# Patient Record
Sex: Female | Born: 1955 | Race: White | Marital: Single | Smoking: Current every day smoker
Health system: Southern US, Community
[De-identification: ages and names within clinical notes are randomized; demographics above are authoritative.]

## PROBLEM LIST (undated history)

## (undated) DIAGNOSIS — I639 Cerebral infarction, unspecified: Secondary | ICD-10-CM

## (undated) DIAGNOSIS — E039 Hypothyroidism, unspecified: Secondary | ICD-10-CM

## (undated) DIAGNOSIS — I10 Essential (primary) hypertension: Secondary | ICD-10-CM

## (undated) DIAGNOSIS — E785 Hyperlipidemia, unspecified: Secondary | ICD-10-CM

---

## 2007-03-20 ENCOUNTER — Emergency Department: Payer: Self-pay | Admitting: Emergency Medicine

## 2014-02-21 ENCOUNTER — Ambulatory Visit: Payer: Self-pay | Admitting: Emergency Medicine

## 2014-02-21 LAB — TSH: Thyroid Stimulating Horm: 95.5 u[IU]/mL — ABNORMAL HIGH

## 2015-04-26 ENCOUNTER — Encounter: Payer: Self-pay | Admitting: Internal Medicine

## 2015-04-26 ENCOUNTER — Other Ambulatory Visit: Payer: Self-pay | Admitting: Internal Medicine

## 2015-04-26 DIAGNOSIS — I10 Essential (primary) hypertension: Secondary | ICD-10-CM | POA: Insufficient documentation

## 2015-04-26 DIAGNOSIS — F17200 Nicotine dependence, unspecified, uncomplicated: Secondary | ICD-10-CM | POA: Insufficient documentation

## 2015-04-26 DIAGNOSIS — I6350 Cerebral infarction due to unspecified occlusion or stenosis of unspecified cerebral artery: Secondary | ICD-10-CM

## 2015-04-26 DIAGNOSIS — E890 Postprocedural hypothyroidism: Secondary | ICD-10-CM | POA: Insufficient documentation

## 2015-04-26 DIAGNOSIS — I639 Cerebral infarction, unspecified: Secondary | ICD-10-CM | POA: Insufficient documentation

## 2018-08-28 ENCOUNTER — Other Ambulatory Visit: Payer: Self-pay

## 2018-08-28 ENCOUNTER — Encounter (HOSPITAL_COMMUNITY): Payer: Self-pay | Admitting: Emergency Medicine

## 2018-08-28 ENCOUNTER — Emergency Department (HOSPITAL_COMMUNITY): Payer: Managed Care, Other (non HMO)

## 2018-08-28 ENCOUNTER — Inpatient Hospital Stay (HOSPITAL_COMMUNITY)
Admission: EM | Admit: 2018-08-28 | Discharge: 2018-08-30 | DRG: 065 | Disposition: A | Discharge status: Home / Self Care | Payer: Managed Care, Other (non HMO) | Attending: Family Medicine | Admitting: Family Medicine

## 2018-08-28 DIAGNOSIS — R402252 Coma scale, best verbal response, oriented, at arrival to emergency department: Secondary | ICD-10-CM | POA: Diagnosis present

## 2018-08-28 DIAGNOSIS — R402362 Coma scale, best motor response, obeys commands, at arrival to emergency department: Secondary | ICD-10-CM | POA: Diagnosis present

## 2018-08-28 DIAGNOSIS — R29810 Facial weakness: Secondary | ICD-10-CM | POA: Diagnosis present

## 2018-08-28 DIAGNOSIS — F17210 Nicotine dependence, cigarettes, uncomplicated: Secondary | ICD-10-CM | POA: Diagnosis present

## 2018-08-28 DIAGNOSIS — Z79890 Hormone replacement therapy (postmenopausal): Secondary | ICD-10-CM

## 2018-08-28 DIAGNOSIS — I10 Essential (primary) hypertension: Secondary | ICD-10-CM | POA: Diagnosis not present

## 2018-08-28 DIAGNOSIS — Z7982 Long term (current) use of aspirin: Secondary | ICD-10-CM

## 2018-08-28 DIAGNOSIS — Z8673 Personal history of transient ischemic attack (TIA), and cerebral infarction without residual deficits: Secondary | ICD-10-CM

## 2018-08-28 DIAGNOSIS — E890 Postprocedural hypothyroidism: Secondary | ICD-10-CM | POA: Diagnosis present

## 2018-08-28 DIAGNOSIS — I639 Cerebral infarction, unspecified: Secondary | ICD-10-CM | POA: Diagnosis not present

## 2018-08-28 DIAGNOSIS — R402132 Coma scale, eyes open, to sound, at arrival to emergency department: Secondary | ICD-10-CM | POA: Diagnosis present

## 2018-08-28 DIAGNOSIS — R29703 NIHSS score 3: Secondary | ICD-10-CM | POA: Diagnosis present

## 2018-08-28 DIAGNOSIS — R471 Dysarthria and anarthria: Secondary | ICD-10-CM | POA: Diagnosis present

## 2018-08-28 DIAGNOSIS — I6339 Cerebral infarction due to thrombosis of other cerebral artery: Secondary | ICD-10-CM

## 2018-08-28 DIAGNOSIS — G8191 Hemiplegia, unspecified affecting right dominant side: Secondary | ICD-10-CM | POA: Diagnosis present

## 2018-08-28 DIAGNOSIS — E785 Hyperlipidemia, unspecified: Secondary | ICD-10-CM | POA: Diagnosis present

## 2018-08-28 HISTORY — DX: Cerebral infarction, unspecified: I63.9

## 2018-08-28 HISTORY — DX: Essential (primary) hypertension: I10

## 2018-08-28 HISTORY — DX: Hypothyroidism, unspecified: E03.9

## 2018-08-28 LAB — CBC
HEMATOCRIT: 39.8 % (ref 36.0–46.0)
Hemoglobin: 13.2 g/dL (ref 12.0–15.0)
MCH: 32.1 pg (ref 26.0–34.0)
MCHC: 33.2 g/dL (ref 30.0–36.0)
MCV: 96.8 fL (ref 80.0–100.0)
Platelets: 243 10*3/uL (ref 150–400)
RBC: 4.11 MIL/uL (ref 3.87–5.11)
RDW: 12.4 % (ref 11.5–15.5)
WBC: 9.6 10*3/uL (ref 4.0–10.5)
nRBC: 0 % (ref 0.0–0.2)

## 2018-08-28 LAB — T4, FREE: Free T4: 1.12 ng/dL (ref 0.82–1.77)

## 2018-08-28 LAB — HEMOGLOBIN A1C
HEMOGLOBIN A1C: 5.4 % (ref 4.8–5.6)
Mean Plasma Glucose: 108.28 mg/dL

## 2018-08-28 LAB — COMPREHENSIVE METABOLIC PANEL
ALK PHOS: 72 U/L (ref 38–126)
ALT: 21 U/L (ref 0–44)
ANION GAP: 6 (ref 5–15)
AST: 17 U/L (ref 15–41)
Albumin: 3.9 g/dL (ref 3.5–5.0)
BUN: 13 mg/dL (ref 8–23)
CO2: 25 mmol/L (ref 22–32)
Calcium: 9.5 mg/dL (ref 8.9–10.3)
Chloride: 104 mmol/L (ref 98–111)
Creatinine, Ser: 0.71 mg/dL (ref 0.44–1.00)
GFR calc Af Amer: >60 mL/min (ref >60)
GFR calc non Af Amer: >60 mL/min (ref >60)
Glucose, Bld: 106 mg/dL — ABNORMAL HIGH (ref 70–99)
Potassium: 3.9 mmol/L (ref 3.5–5.1)
Sodium: 135 mmol/L (ref 135–145)
Total Bilirubin: 0.4 mg/dL (ref 0.3–1.2)
Total Protein: 6.9 g/dL (ref 6.5–8.1)

## 2018-08-28 LAB — POCT I-STAT 7, (LYTES, BLD GAS, ICA,H+H)
Acid-Base Excess: 1 mmol/L (ref 0.0–2.0)
Bicarbonate: 25.7 mmol/L (ref 20.0–28.0)
CALCIUM ION: 1.23 mmol/L (ref 1.15–1.40)
HEMATOCRIT: 36 % (ref 36.0–46.0)
Hemoglobin: 12.2 g/dL (ref 12.0–15.0)
O2 Saturation: 95 %
Potassium: 3.8 mmol/L (ref 3.5–5.1)
Sodium: 140 mmol/L (ref 135–145)
TCO2: 27 mmol/L (ref 22–32)
pCO2 arterial: 41.4 mmHg (ref 32.0–48.0)
pH, Arterial: 7.402 (ref 7.350–7.450)
pO2, Arterial: 74 mmHg — ABNORMAL LOW (ref 83.0–108.0)

## 2018-08-28 LAB — LIPID PANEL
Cholesterol: 228 mg/dL — ABNORMAL HIGH (ref 0–200)
HDL: 48 mg/dL (ref >40)
LDL Cholesterol: 167 mg/dL — ABNORMAL HIGH (ref 0–99)
Total CHOL/HDL Ratio: 4.8 RATIO
Triglycerides: 64 mg/dL (ref <150)
VLDL: 13 mg/dL (ref 0–40)

## 2018-08-28 LAB — DIFFERENTIAL
Abs Immature Granulocytes: 0.02 10*3/uL (ref 0.00–0.07)
Basophils Absolute: 0.1 10*3/uL (ref 0.0–0.1)
Basophils Relative: 1 %
Eosinophils Absolute: 0.2 10*3/uL (ref 0.0–0.5)
Eosinophils Relative: 2 %
IMMATURE GRANULOCYTES: 0 %
LYMPHS ABS: 2.4 10*3/uL (ref 0.7–4.0)
Lymphocytes Relative: 25 %
Monocytes Absolute: 0.5 10*3/uL (ref 0.1–1.0)
Monocytes Relative: 5 %
Neutro Abs: 6.3 10*3/uL (ref 1.7–7.7)
Neutrophils Relative %: 67 %

## 2018-08-28 LAB — APTT: aPTT: 30 seconds (ref 24–36)

## 2018-08-28 LAB — TSH: TSH: 0.86 u[IU]/mL (ref 0.350–4.500)

## 2018-08-28 LAB — ETHANOL: Alcohol, Ethyl (B): <10 mg/dL (ref <10)

## 2018-08-28 LAB — PROTIME-INR
INR: 0.9 (ref 0.8–1.2)
Prothrombin Time: 12.5 seconds (ref 11.4–15.2)

## 2018-08-28 MED ORDER — GADOBUTROL 1 MMOL/ML IV SOLN
10.0000 mL | Freq: Once | INTRAVENOUS | Status: AC | PRN
  Administered 2018-08-28: 10 mL via INTRAVENOUS

## 2018-08-28 MED ORDER — ATORVASTATIN CALCIUM 40 MG PO TABS
40.0000 mg | ORAL_TABLET | Freq: Every day | ORAL | Status: DC
  Administered 2018-08-29: 40 mg via ORAL
  Filled 2018-08-28: qty 1

## 2018-08-28 MED ORDER — ENOXAPARIN SODIUM 40 MG/0.4ML ~~LOC~~ SOLN
40.0000 mg | SUBCUTANEOUS | Status: DC
  Administered 2018-08-29: 40 mg via SUBCUTANEOUS
  Filled 2018-08-28: qty 0.4

## 2018-08-28 MED ORDER — NICOTINE 21 MG/24HR TD PT24
21.0000 mg | MEDICATED_PATCH | Freq: Every day | TRANSDERMAL | Status: DC
  Filled 2018-08-28: qty 1

## 2018-08-28 MED ORDER — ASPIRIN EC 81 MG PO TBEC
81.0000 mg | DELAYED_RELEASE_TABLET | Freq: Every day | ORAL | Status: DC
  Administered 2018-08-29 – 2018-08-30 (×2): 81 mg via ORAL
  Filled 2018-08-28 (×2): qty 1

## 2018-08-28 MED ORDER — ONDANSETRON HCL 4 MG PO TABS: 4.0000 mg | ORAL_TABLET | Freq: Four times a day (QID) | ORAL | Status: DC | PRN

## 2018-08-28 MED ORDER — ACETAMINOPHEN 325 MG PO TABS: 650.0000 mg | ORAL_TABLET | Freq: Four times a day (QID) | ORAL | Status: DC | PRN

## 2018-08-28 MED ORDER — SODIUM CHLORIDE 0.9% FLUSH: 3.0000 mL | INTRAVENOUS | Status: DC | PRN

## 2018-08-28 MED ORDER — SODIUM CHLORIDE 0.9% FLUSH
3.0000 mL | Freq: Two times a day (BID) | INTRAVENOUS | Status: DC
  Administered 2018-08-28: 3 mL via INTRAVENOUS

## 2018-08-28 MED ORDER — SODIUM CHLORIDE 0.9 % IV SOLN: 250.0000 mL | INTRAVENOUS | Status: DC | PRN

## 2018-08-28 MED ORDER — ACETAMINOPHEN 650 MG RE SUPP: 650.0000 mg | Freq: Four times a day (QID) | RECTAL | Status: DC | PRN

## 2018-08-28 MED ORDER — STROKE: EARLY STAGES OF RECOVERY BOOK
Freq: Once | Status: AC
  Administered 2018-08-29: 1
  Filled 2018-08-28: qty 1

## 2018-08-28 MED ORDER — ONDANSETRON HCL 4 MG/2ML IJ SOLN: 4.0000 mg | Freq: Four times a day (QID) | INTRAMUSCULAR | Status: DC | PRN
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
## 2018-08-28 NOTE — ED Notes (Signed)
Patient is lethargic and unable to stay awake; also unable to keep saliva in mouth when talking-she has therefore failed the swallow test for now Will re-assess when appropriate
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
## 2018-08-28 NOTE — ED Notes (Signed)
Patient transported to MRI 
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
## 2018-08-28 NOTE — ED Notes (Signed)
Patient's daughter at bedside Reports that PT had a stroke 3 years ago.  Patient reports taking "125mg  of thyroid medicine."

## 2018-08-29 ENCOUNTER — Inpatient Hospital Stay (HOSPITAL_COMMUNITY): Payer: Managed Care, Other (non HMO)

## 2018-08-29 ENCOUNTER — Observation Stay (HOSPITAL_COMMUNITY): Payer: Managed Care, Other (non HMO)

## 2018-08-29 DIAGNOSIS — E890 Postprocedural hypothyroidism: Secondary | ICD-10-CM | POA: Diagnosis present

## 2018-08-29 DIAGNOSIS — I6381 Other cerebral infarction due to occlusion or stenosis of small artery: Secondary | ICD-10-CM

## 2018-08-29 DIAGNOSIS — G8191 Hemiplegia, unspecified affecting right dominant side: Secondary | ICD-10-CM | POA: Diagnosis present

## 2018-08-29 DIAGNOSIS — R29810 Facial weakness: Secondary | ICD-10-CM | POA: Diagnosis present

## 2018-08-29 DIAGNOSIS — E785 Hyperlipidemia, unspecified: Secondary | ICD-10-CM | POA: Diagnosis present

## 2018-08-29 DIAGNOSIS — F17210 Nicotine dependence, cigarettes, uncomplicated: Secondary | ICD-10-CM | POA: Diagnosis present

## 2018-08-29 DIAGNOSIS — I6350 Cerebral infarction due to unspecified occlusion or stenosis of unspecified cerebral artery: Secondary | ICD-10-CM

## 2018-08-29 DIAGNOSIS — R402252 Coma scale, best verbal response, oriented, at arrival to emergency department: Secondary | ICD-10-CM | POA: Diagnosis present

## 2018-08-29 DIAGNOSIS — R29703 NIHSS score 3: Secondary | ICD-10-CM | POA: Diagnosis present

## 2018-08-29 DIAGNOSIS — Z7982 Long term (current) use of aspirin: Secondary | ICD-10-CM | POA: Diagnosis not present

## 2018-08-29 DIAGNOSIS — R402362 Coma scale, best motor response, obeys commands, at arrival to emergency department: Secondary | ICD-10-CM | POA: Diagnosis present

## 2018-08-29 DIAGNOSIS — I639 Cerebral infarction, unspecified: Secondary | ICD-10-CM | POA: Diagnosis present

## 2018-08-29 DIAGNOSIS — R402132 Coma scale, eyes open, to sound, at arrival to emergency department: Secondary | ICD-10-CM | POA: Diagnosis present

## 2018-08-29 DIAGNOSIS — I10 Essential (primary) hypertension: Secondary | ICD-10-CM | POA: Diagnosis present

## 2018-08-29 DIAGNOSIS — Z79890 Hormone replacement therapy (postmenopausal): Secondary | ICD-10-CM | POA: Diagnosis not present

## 2018-08-29 DIAGNOSIS — R471 Dysarthria and anarthria: Secondary | ICD-10-CM | POA: Diagnosis present

## 2018-08-29 DIAGNOSIS — Z8673 Personal history of transient ischemic attack (TIA), and cerebral infarction without residual deficits: Secondary | ICD-10-CM | POA: Diagnosis not present

## 2018-08-29 LAB — ECHOCARDIOGRAM COMPLETE
Height: 68 in
Weight: 3580.27 oz

## 2018-08-29 LAB — CBC
HCT: 38.4 % (ref 36.0–46.0)
Hemoglobin: 12.6 g/dL (ref 12.0–15.0)
MCH: 31 pg (ref 26.0–34.0)
MCHC: 32.8 g/dL (ref 30.0–36.0)
MCV: 94.6 fL (ref 80.0–100.0)
Platelets: 234 10*3/uL (ref 150–400)
RBC: 4.06 MIL/uL (ref 3.87–5.11)
RDW: 12.3 % (ref 11.5–15.5)
WBC: 8.2 10*3/uL (ref 4.0–10.5)
nRBC: 0 % (ref 0.0–0.2)

## 2018-08-29 LAB — RAPID URINE DRUG SCREEN, HOSP PERFORMED
Amphetamines: NOT DETECTED
BENZODIAZEPINES: NOT DETECTED
Barbiturates: NOT DETECTED
Cocaine: NOT DETECTED
Opiates: NOT DETECTED
Tetrahydrocannabinol: NOT DETECTED

## 2018-08-29 LAB — BASIC METABOLIC PANEL
Anion gap: 9 (ref 5–15)
BUN: 12 mg/dL (ref 8–23)
CO2: 25 mmol/L (ref 22–32)
Calcium: 8.9 mg/dL (ref 8.9–10.3)
Chloride: 105 mmol/L (ref 98–111)
Creatinine, Ser: 0.73 mg/dL (ref 0.44–1.00)
GFR calc Af Amer: >60 mL/min (ref >60)
Glucose, Bld: 110 mg/dL — ABNORMAL HIGH (ref 70–99)
POTASSIUM: 3.5 mmol/L (ref 3.5–5.1)
Sodium: 139 mmol/L (ref 135–145)

## 2018-08-29 LAB — HIV ANTIBODY (ROUTINE TESTING W REFLEX): HIV Screen 4th Generation wRfx: NONREACTIVE

## 2018-08-29 MED ORDER — RESOURCE THICKENUP CLEAR PO POWD
ORAL | Status: DC | PRN
  Filled 2018-08-29: qty 125

## 2018-08-29 MED ORDER — CLOPIDOGREL BISULFATE 75 MG PO TABS
75.0000 mg | ORAL_TABLET | Freq: Every day | ORAL | Status: DC
  Administered 2018-08-30: 75 mg via ORAL
  Filled 2018-08-29: qty 1

## 2018-08-29 MED ORDER — LEVOTHYROXINE SODIUM 75 MCG PO TABS
175.0000 ug | ORAL_TABLET | Freq: Every day | ORAL | Status: DC
  Administered 2018-08-29 – 2018-08-30 (×2): 175 ug via ORAL
  Filled 2018-08-29 (×2): qty 1

## 2018-08-29 MED ORDER — LISINOPRIL 10 MG PO TABS
10.0000 mg | ORAL_TABLET | Freq: Every day | ORAL | Status: DC
  Administered 2018-08-29 – 2018-08-30 (×2): 10 mg via ORAL
  Filled 2018-08-29 (×2): qty 1

## 2018-08-29 MED ORDER — IOHEXOL 350 MG/ML SOLN
75.0000 mL | Freq: Once | INTRAVENOUS | Status: AC | PRN
  Administered 2018-08-29: 75 mL via INTRAVENOUS

## 2018-08-29 NOTE — Progress Notes (Signed)
SLP Cancellation Note  Patient Details Name: Laurie Valenzuela MRN: [id number] DOB: 08/04/1955   Cancelled treatment:       Reason Eval/Treat Not Completed: Patient at procedure or test/unavailable. Transport taking pt to vascular lab. Will continue efforts.  Rondel Baton, [place], CCC-SLP Speech-Language Pathologist Acute Rehabilitation Services Pager: [phone] Office: [phone]    [name] 08/29/2018, 9:24 AM
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
## 2018-08-29 NOTE — Progress Notes (Signed)
Companion, who  Is at bedside, says she is not her normal self. She, at one time got out of bed, half naked, attempting to wipe her bottom with paper towel.  I cleaned  Her up, got her settled. She is confused.,at times, but answers questions appropriately. Dysarthric. Bed alarm on. Safety maintained.

## 2018-08-29 NOTE — Progress Notes (Signed)
  Echocardiogram 2D Echocardiogram has been performed.  Delcie Roch 08/29/2018, 10:34 AM

## 2018-08-29 NOTE — Progress Notes (Signed)
Patient is now more alert, abel to tell name and place but disoriented to time and situation. YALE swallow eval reeval, pt coughs. SLP consulted.    Safety precautions and orders reviewed. PT/family educated on fall risk and using call lights for assistance,. Sig other requests to sit on the bed with pt at this time n requested to have bed exit removed. RN reinforce bed alarms. Family declined and verbalized will let staffs know if pt needing to get up. Will cont to monitor.   Sim Boast, RN
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
## 2018-08-29 NOTE — Progress Notes (Signed)
Sent page to Dr Bruna Potter. B/P 163/89. Patient has been NPO. Urine emptied when first arrived. No urine as of yet. Bed alarm on. Continue to monitor.
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
## 2018-08-29 NOTE — Progress Notes (Signed)
VASCULAR LAB PRELIMINARY  PRELIMINARY  PRELIMINARY  PRELIMINARY  Carotid duplex completed.    Preliminary report:  See CV Proc  Pragya Lofaso, RVT 08/29/2018, 9:52 AM

## 2018-08-30 ENCOUNTER — Other Ambulatory Visit: Payer: Self-pay | Admitting: Cardiology

## 2018-08-30 DIAGNOSIS — I6381 Other cerebral infarction due to occlusion or stenosis of small artery: Secondary | ICD-10-CM

## 2018-08-30 DIAGNOSIS — I639 Cerebral infarction, unspecified: Secondary | ICD-10-CM

## 2018-08-30 LAB — CBC WITH DIFFERENTIAL/PLATELET
Abs Immature Granulocytes: 0.02 10*3/uL (ref 0.00–0.07)
BASOS PCT: 1 %
Basophils Absolute: 0.1 10*3/uL (ref 0.0–0.1)
EOS ABS: 0.2 10*3/uL (ref 0.0–0.5)
Eosinophils Relative: 3 %
HCT: 39.1 % (ref 36.0–46.0)
Hemoglobin: 13.3 g/dL (ref 12.0–15.0)
Immature Granulocytes: 0 %
Lymphocytes Relative: 36 %
Lymphs Abs: 3 10*3/uL (ref 0.7–4.0)
MCH: 32.4 pg (ref 26.0–34.0)
MCHC: 34 g/dL (ref 30.0–36.0)
MCV: 95.1 fL (ref 80.0–100.0)
Monocytes Absolute: 0.6 10*3/uL (ref 0.1–1.0)
Monocytes Relative: 8 %
Neutro Abs: 4.3 10*3/uL (ref 1.7–7.7)
Neutrophils Relative %: 52 %
PLATELETS: 239 10*3/uL (ref 150–400)
RBC: 4.11 MIL/uL (ref 3.87–5.11)
RDW: 12.1 % (ref 11.5–15.5)
WBC: 8.1 10*3/uL (ref 4.0–10.5)
nRBC: 0 % (ref 0.0–0.2)

## 2018-08-30 LAB — BASIC METABOLIC PANEL
Anion gap: 7 (ref 5–15)
BUN: 12 mg/dL (ref 8–23)
CALCIUM: 9 mg/dL (ref 8.9–10.3)
CO2: 27 mmol/L (ref 22–32)
Chloride: 106 mmol/L (ref 98–111)
Creatinine, Ser: 0.8 mg/dL (ref 0.44–1.00)
GFR calc Af Amer: >60 mL/min (ref >60)
GFR calc non Af Amer: >60 mL/min (ref >60)
Glucose, Bld: 104 mg/dL — ABNORMAL HIGH (ref 70–99)
Potassium: 3.7 mmol/L (ref 3.5–5.1)
Sodium: 140 mmol/L (ref 135–145)

## 2018-08-30 MED ORDER — ATORVASTATIN CALCIUM 40 MG PO TABS: 40.0000 mg | ORAL_TABLET | Freq: Every day | ORAL | 0 refills | Status: DC

## 2018-08-30 MED ORDER — LISINOPRIL 10 MG PO TABS: 10.0000 mg | ORAL_TABLET | Freq: Every day | ORAL | 0 refills | Status: DC

## 2018-08-30 MED ORDER — NICOTINE 21 MG/24HR TD PT24: 21.0000 mg | MEDICATED_PATCH | Freq: Every day | TRANSDERMAL | 0 refills | Status: DC

## 2018-08-30 MED ORDER — ASPIRIN 81 MG PO CHEW: 81.0000 mg | CHEWABLE_TABLET | Freq: Every day | ORAL | 0 refills | Status: DC

## 2018-08-30 MED ORDER — RESOURCE THICKENUP CLEAR PO POWD: ORAL | 0 refills | Status: DC

## 2018-08-30 MED ORDER — CLOPIDOGREL BISULFATE 75 MG PO TABS: 75.0000 mg | ORAL_TABLET | Freq: Every day | ORAL | 0 refills | Status: DC
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
## 2018-08-30 NOTE — Progress Notes (Signed)
SLP Cancellation Note  Patient Details Name: Laurie Valenzuela MRN: [id number] DOB: 07-05-1955   Cancelled treatment:       Reason Eval/Treat Not Completed: Other (comment). Attempted to schedule MBS today, however fluoro unable to perform today. Pt continues with overt signs of aspiration with thin liquids, advise DYS 3, NECTAR thick liquids at discharge. Recommend pt return for MBS as an outpatient.   Rondel Baton, [place], CCC-SLP Speech-Language Pathologist Acute Rehabilitation Services Pager: [phone] Office: [phone]    [name] 08/30/2018, 2:47 PM

## 2018-08-30 NOTE — Care Management Note (Signed)
Case Management Note RN CM weekend coverage for [facility] [phone]  Patient Details  Name: Laurie Valenzuela MRN: [id number] Date of Birth: May 20, 1956  Subjective/Objective:  Pt admitted with CVA                  Action/Plan: PTA pt lived at home, referral received for outpt rehab needs for PT/OT/SLP- spoke with pt at bedside to discuss Neuro outpt rehab and location- pt agreeable to Neuro rehab referral. No DME needs noted. Pt also will need info for PCP- needs - discussed using insurance card to ask for in-network providers and will provide pt with [facility] # for assistance.   Expected Discharge Date:  08/29/18               Expected Discharge Plan:  OP Rehab  In-House Referral:     Discharge planning Services  CM Consult, Other - See comment  Post Acute Care Choice:    Choice offered to:  Patient  DME Arranged:    DME Agency:     HH Arranged:    HH Agency:     Status of Service:  Completed, signed off  If discussed at Long Length of Stay Meetings, dates discussed:    Discharge Disposition: home/self care   Additional Comments:  Darrold Span, RN 08/30/2018, 3:30 PM
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
## 2018-08-31 ENCOUNTER — Other Ambulatory Visit: Payer: Self-pay | Admitting: Cardiology

## 2018-08-31 DIAGNOSIS — I639 Cerebral infarction, unspecified: Secondary | ICD-10-CM

## 2018-09-01 ENCOUNTER — Observation Stay: Payer: Managed Care, Other (non HMO)

## 2018-09-01 ENCOUNTER — Ambulatory Visit: Payer: Managed Care, Other (non HMO) | Attending: Family Medicine | Admitting: Occupational Therapy

## 2018-09-01 ENCOUNTER — Other Ambulatory Visit: Payer: Self-pay

## 2018-09-01 ENCOUNTER — Encounter: Payer: Self-pay | Admitting: Emergency Medicine

## 2018-09-01 ENCOUNTER — Observation Stay
Admission: EM | Admit: 2018-09-01 | Discharge: 2018-09-02 | Disposition: A | Discharge status: Other Acute Inpatient Hospital | Payer: Managed Care, Other (non HMO) | Attending: Internal Medicine | Admitting: Internal Medicine

## 2018-09-01 ENCOUNTER — Encounter: Payer: Self-pay | Admitting: Physical Therapy

## 2018-09-01 ENCOUNTER — Emergency Department: Payer: Managed Care, Other (non HMO)

## 2018-09-01 ENCOUNTER — Ambulatory Visit: Payer: Managed Care, Other (non HMO) | Admitting: Physical Therapy

## 2018-09-01 DIAGNOSIS — Z79890 Hormone replacement therapy (postmenopausal): Secondary | ICD-10-CM | POA: Insufficient documentation

## 2018-09-01 DIAGNOSIS — S82391A Other fracture of lower end of right tibia, initial encounter for closed fracture: Secondary | ICD-10-CM | POA: Diagnosis present

## 2018-09-01 DIAGNOSIS — Z79899 Other long term (current) drug therapy: Secondary | ICD-10-CM | POA: Insufficient documentation

## 2018-09-01 DIAGNOSIS — Z87891 Personal history of nicotine dependence: Secondary | ICD-10-CM | POA: Diagnosis not present

## 2018-09-01 DIAGNOSIS — I69851 Hemiplegia and hemiparesis following other cerebrovascular disease affecting right dominant side: Secondary | ICD-10-CM | POA: Insufficient documentation

## 2018-09-01 DIAGNOSIS — E039 Hypothyroidism, unspecified: Secondary | ICD-10-CM | POA: Insufficient documentation

## 2018-09-01 DIAGNOSIS — Z7982 Long term (current) use of aspirin: Secondary | ICD-10-CM | POA: Diagnosis not present

## 2018-09-01 DIAGNOSIS — W010XXA Fall on same level from slipping, tripping and stumbling without subsequent striking against object, initial encounter: Secondary | ICD-10-CM | POA: Diagnosis not present

## 2018-09-01 DIAGNOSIS — R0602 Shortness of breath: Secondary | ICD-10-CM | POA: Insufficient documentation

## 2018-09-01 DIAGNOSIS — R29818 Other symptoms and signs involving the nervous system: Secondary | ICD-10-CM

## 2018-09-01 DIAGNOSIS — S82491A Other fracture of shaft of right fibula, initial encounter for closed fracture: Secondary | ICD-10-CM | POA: Diagnosis not present

## 2018-09-01 DIAGNOSIS — I69318 Other symptoms and signs involving cognitive functions following cerebral infarction: Secondary | ICD-10-CM

## 2018-09-01 DIAGNOSIS — Z7902 Long term (current) use of antithrombotics/antiplatelets: Secondary | ICD-10-CM | POA: Insufficient documentation

## 2018-09-01 DIAGNOSIS — R41842 Visuospatial deficit: Secondary | ICD-10-CM

## 2018-09-01 DIAGNOSIS — E785 Hyperlipidemia, unspecified: Secondary | ICD-10-CM | POA: Insufficient documentation

## 2018-09-01 DIAGNOSIS — M6281 Muscle weakness (generalized): Secondary | ICD-10-CM

## 2018-09-01 DIAGNOSIS — R2689 Other abnormalities of gait and mobility: Secondary | ICD-10-CM

## 2018-09-01 DIAGNOSIS — I10 Essential (primary) hypertension: Secondary | ICD-10-CM | POA: Diagnosis not present

## 2018-09-01 DIAGNOSIS — R2681 Unsteadiness on feet: Secondary | ICD-10-CM

## 2018-09-01 DIAGNOSIS — R278 Other lack of coordination: Secondary | ICD-10-CM

## 2018-09-01 DIAGNOSIS — S82409A Unspecified fracture of shaft of unspecified fibula, initial encounter for closed fracture: Secondary | ICD-10-CM

## 2018-09-01 DIAGNOSIS — S82209A Unspecified fracture of shaft of unspecified tibia, initial encounter for closed fracture: Secondary | ICD-10-CM | POA: Diagnosis present

## 2018-09-01 HISTORY — DX: Hyperlipidemia, unspecified: E78.5

## 2018-09-01 LAB — COMPREHENSIVE METABOLIC PANEL
ALT: 32 U/L (ref 0–44)
ANION GAP: 8 (ref 5–15)
AST: 29 U/L (ref 15–41)
Albumin: 4.1 g/dL (ref 3.5–5.0)
Alkaline Phosphatase: 74 U/L (ref 38–126)
BUN: 18 mg/dL (ref 8–23)
CO2: 27 mmol/L (ref 22–32)
Calcium: 9.1 mg/dL (ref 8.9–10.3)
Chloride: 105 mmol/L (ref 98–111)
Creatinine, Ser: 0.69 mg/dL (ref 0.44–1.00)
GFR calc non Af Amer: >60 mL/min (ref >60)
Glucose, Bld: 109 mg/dL — ABNORMAL HIGH (ref 70–99)
Potassium: 4 mmol/L (ref 3.5–5.1)
Sodium: 140 mmol/L (ref 135–145)
Total Bilirubin: 0.6 mg/dL (ref 0.3–1.2)
Total Protein: 7.4 g/dL (ref 6.5–8.1)

## 2018-09-01 LAB — CBC
HCT: 39.1 % (ref 36.0–46.0)
Hemoglobin: 12.9 g/dL (ref 12.0–15.0)
MCH: 31.9 pg (ref 26.0–34.0)
MCHC: 33 g/dL (ref 30.0–36.0)
MCV: 96.5 fL (ref 80.0–100.0)
Platelets: 260 10*3/uL (ref 150–400)
RBC: 4.05 MIL/uL (ref 3.87–5.11)
RDW: 12 % (ref 11.5–15.5)
WBC: 10.7 10*3/uL — ABNORMAL HIGH (ref 4.0–10.5)
nRBC: 0 % (ref 0.0–0.2)

## 2018-09-01 LAB — URINALYSIS, COMPLETE (UACMP) WITH MICROSCOPIC
Bilirubin Urine: NEGATIVE
GLUCOSE, UA: NEGATIVE mg/dL
Ketones, ur: NEGATIVE mg/dL
Leukocytes,Ua: NEGATIVE
Nitrite: POSITIVE — AB
Protein, ur: NEGATIVE mg/dL
Specific Gravity, Urine: 1.009 (ref 1.005–1.030)
pH: 6 (ref 5.0–8.0)

## 2018-09-01 LAB — PROTIME-INR
INR: 1 (ref 0.8–1.2)
PROTHROMBIN TIME: 13.3 s (ref 11.4–15.2)

## 2018-09-01 LAB — APTT: aPTT: 30 seconds (ref 24–36)

## 2018-09-01 LAB — SURGICAL PCR SCREEN
MRSA, PCR: NEGATIVE
Staphylococcus aureus: NEGATIVE

## 2018-09-01 MED ORDER — MORPHINE SULFATE (PF) 2 MG/ML IV SOLN
2.0000 mg | INTRAVENOUS | Status: DC | PRN
  Administered 2018-09-01 – 2018-09-02 (×2): 2 mg via INTRAVENOUS
  Filled 2018-09-01 (×2): qty 1

## 2018-09-01 MED ORDER — NICOTINE 21 MG/24HR TD PT24
21.0000 mg | MEDICATED_PATCH | Freq: Every day | TRANSDERMAL | Status: DC
  Administered 2018-09-02: 21 mg via TRANSDERMAL
  Filled 2018-09-01: qty 1

## 2018-09-01 MED ORDER — ATORVASTATIN CALCIUM 20 MG PO TABS
40.0000 mg | ORAL_TABLET | Freq: Every day | ORAL | Status: DC
  Filled 2018-09-01 (×2): qty 2

## 2018-09-01 MED ORDER — FUROSEMIDE 10 MG/ML IJ SOLN
40.0000 mg | Freq: Once | INTRAMUSCULAR | Status: AC
  Administered 2018-09-01: 40 mg via INTRAVENOUS
  Filled 2018-09-01: qty 4

## 2018-09-01 MED ORDER — ONDANSETRON HCL 4 MG/2ML IJ SOLN
4.0000 mg | Freq: Once | INTRAMUSCULAR | Status: AC
  Administered 2018-09-01: 4 mg via INTRAVENOUS
  Filled 2018-09-01: qty 2

## 2018-09-01 MED ORDER — ONDANSETRON HCL 4 MG PO TABS: 4.0000 mg | ORAL_TABLET | Freq: Four times a day (QID) | ORAL | Status: DC | PRN

## 2018-09-01 MED ORDER — LISINOPRIL 10 MG PO TABS
10.0000 mg | ORAL_TABLET | Freq: Every day | ORAL | Status: DC
  Administered 2018-09-02: 10 mg via ORAL
  Filled 2018-09-01: qty 1

## 2018-09-01 MED ORDER — CLOPIDOGREL BISULFATE 75 MG PO TABS
75.0000 mg | ORAL_TABLET | Freq: Every day | ORAL | Status: DC
  Administered 2018-09-02: 75 mg via ORAL
  Filled 2018-09-01: qty 1

## 2018-09-01 MED ORDER — ACETAMINOPHEN 650 MG RE SUPP: 650.0000 mg | Freq: Four times a day (QID) | RECTAL | Status: DC | PRN

## 2018-09-01 MED ORDER — ASPIRIN 81 MG PO CHEW
81.0000 mg | CHEWABLE_TABLET | Freq: Every day | ORAL | Status: DC
  Administered 2018-09-02: 81 mg via ORAL
  Filled 2018-09-01: qty 1

## 2018-09-01 MED ORDER — ADULT MULTIVITAMIN W/MINERALS CH
1.0000 | ORAL_TABLET | Freq: Every day | ORAL | Status: DC
  Administered 2018-09-02: 1 via ORAL
  Filled 2018-09-01: qty 1

## 2018-09-01 MED ORDER — OXYCODONE HCL 5 MG PO TABS
5.0000 mg | ORAL_TABLET | ORAL | Status: DC | PRN
  Administered 2018-09-01 – 2018-09-02 (×3): 5 mg via ORAL
  Filled 2018-09-01 (×3): qty 1

## 2018-09-01 MED ORDER — LEVOTHYROXINE SODIUM 175 MCG PO TABS
175.0000 ug | ORAL_TABLET | Freq: Every day | ORAL | Status: DC
  Administered 2018-09-02: 175 ug via ORAL
  Filled 2018-09-01 (×2): qty 1

## 2018-09-01 MED ORDER — MORPHINE SULFATE (PF) 4 MG/ML IV SOLN
4.0000 mg | Freq: Once | INTRAVENOUS | Status: AC
  Administered 2018-09-01: 4 mg via INTRAVENOUS
  Filled 2018-09-01: qty 1

## 2018-09-01 MED ORDER — ACETAMINOPHEN 325 MG PO TABS: 650.0000 mg | ORAL_TABLET | Freq: Four times a day (QID) | ORAL | Status: DC | PRN

## 2018-09-01 MED ORDER — ONDANSETRON HCL 4 MG/2ML IJ SOLN: 4.0000 mg | Freq: Four times a day (QID) | INTRAMUSCULAR | Status: DC | PRN
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
## 2018-09-01 NOTE — Therapy (Signed)
[facility] [address] [place], [place], [address] Phone: [phone]   Fax:  [phone]  Occupational Therapy Evaluation  Patient Details  Name: Laurie Valenzuela MRN: [id number] Date of Birth: 03/13/1956 Referring Provider (OT): Dr. Lowell GuitarPowell   Encounter Date: 09/01/2018  OT End of Session - 09/01/18 1430    Visit Number  1    Number of Visits  25    Date for OT Re-Evaluation  11/01/18    Authorization Type  [employer] Managed    OT Start Time  1015    OT Stop Time  1100    OT Time Calculation (min)  45 min    Activity Tolerance  Patient tolerated treatment well    Behavior During Therapy  [facility] for tasks assessed/performed       Past Medical History:  Diagnosis Date  . HTN (hypertension)   . Hypothyroidism   . Stroke [facility](HCC)    2017    Past Surgical History:  Procedure Laterality Date  . CESAREAN SECTION      There were no vitals filed for this visit.  Subjective Assessment - 09/01/18 1023    Patient is accompanied by:  Family member   daughter Isabelle Course([name])   Pertinent History  Lt BG CVA 08/28/18. PMH: CVA 2016, HTN, HLD, small vessel dz    Limitations  **nectar thick diet, fall risk, no driving, needs 24 hr. sup currently    Patient Stated Goals  Get back to work    Currently in Pain?  No/denies        [facility] - 09/01/18 0001      Assessment   Medical Diagnosis  Lt CVA (Basal Ganglia)    Referring Provider (OT)  Dr. Lowell GuitarPowell    Onset Date/Surgical Date  08/28/18    Hand Dominance  Right      Precautions   Precautions  Fall    Precaution Comments  nectar thick diet d/t dysphagia, no driving, 24 hr. supervision right now      Balance Screen   Has the patient fallen in the past 6 months  No      Home  Environment   Bathroom Shower/Tub  Tub/Shower unit;Curtain   all in shower seat but pt doesn't use   Additional Comments  Currently has 24 hr supervision b/t daughter and pt's  boyfriend. Pt lives in 2 story home w/ 4 steps to enter home. 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 supervision      IADL   Shopping  --   Has not attempted - will need help driving   Light Housekeeping  --   has  not yet attempted    Meal Prep  Able to complete simple cold meal and snack prep    Community Mobility  Relies on family or friends for transportation   currently   Medication Management  Has difficulty remembering to take medication   boyfriend assisting      Mobility   Mobility Status  Independent      Written Expression   Dominant Hand  Right    Handwriting  90% legible   100% for name, possible expressive written aphasia     Vision - History   Baseline Vision  Wears glasses only for reading      Vision Assessment   Ocular Range of Motion  Within Functional Limits    Tracking/Visual Pursuits  --   loses fixation especially to Lt   Convergence  Impaired (comment)    Diplopia Assessment  Present in primary gaze    Comment  Lt eye ptosis noted      Cognition   Attention  Focused    Memory  Impaired    Memory Impairment  --   delayed recall 2/3   Awareness  Impaired    Awareness Impairment  Emergent impairment    Cognition Comments  Poor historian and not accurate w/ questions t/o evaluation -  daughter has to clarify. Pt w/ difficulty following 2 step directions. Pt unable to spell world backwards, unable to count backwards by 7's (possibly d/t expressive aphasia?)      Sensation   Light Touch  Appears Intact      Coordination   9 Hole Peg Test  Right;Left    Right 9 Hole Peg Test  31.62 sec    Left 9 Hole Peg Test  24.19 sec      Edema   Edema  mild Rt hand       ROM / Strength   AROM / PROM / Strength  AROM;Strength      AROM   Overall AROM Comments  BUE AROM WFL's except Rt shoulder end range flexion      Strength   Overall Strength Comments  LUE MMT grossly 5/5, RUE MMT 4/5 for flex, abd, and ER      Hand Function   Right Hand Grip (lbs)  70 lbs    Left Hand Grip (lbs)  72 lbs                        OT Short Term Goals - 09/01/18 1436      OT SHORT TERM GOAL #1   Title  Independent with initial HEP for RUE high level ROM, strengthening, and coordination - 10/02/18    Time  4    Period  Weeks    Status  New      OT SHORT TERM GOAL #2   Title  Pt to report greater ease with hooking/unhooking buttons    Time  4    Period  Weeks    Status  New      OT SHORT TERM GOAL #3   Title  Pt to improve coordination Rt hand as evidenced by performing 9 hole peg test in 25 sec. or less    Baseline  31.62 sec    Time  4    Period  Weeks    Status  New      OT SHORT TERM GOAL #4   Title  Pt to verbalize understanding with how to implement memory strategies for medication  management, keeping track of appointments, bill paying    Time  4    Period  Weeks    Status  New      OT SHORT TERM GOAL #5   Title  Pt to make simple familiar meal w/ supervision/min cueing only     Time  4    Period  Weeks    Status  New      Additional Short Term Goals   Additional Short Term Goals  Yes      OT SHORT TERM GOAL #6   Title  Pt to perform basic money management (addition/subtraction) for financial tasks/bill paying    Time  4    Period  Weeks    Status   New      OT SHORT TERM GOAL #7   Title  Pt to perform environmental scanning with 90% accuracy during ambulation    Time  4    Period  Weeks    Status  New        OT Long Term Goals - 09/01/18 1443      OT LONG TERM GOAL #1   Title  Independent w/ vision HEP prn - 11/01/18    Time  8    Period  Weeks    Status  New      OT LONG TERM GOAL #2   Title  Pt to return to cooking 2 dish meal w/ distant supervision     Time  8    Period  Weeks    Status  New      OT LONG TERM GOAL #3   Title  Pt to return to light household management tasks at mod I level    Time  8    Period  Weeks    Status  New      OT LONG TERM GOAL #4   Title  Pt to perform financial management tasks using calculator at 90% or greater accuracy    Time  8    Period  Weeks    Status  New      OT LONG TERM GOAL #5   Title  Pt to demo sufficient organization and problem solving for simple work simulated tasks (making schedule for employees, etc)    Time  8    Period  Weeks    Status  New      Long Term Additional Goals   Additional Long Term Goals  Yes      OT LONG TERM GOAL #6   Title  Pt to perform environmental scanning w/ physical task for divided attention w/ 90% or greater accuracy in prep for potential return to driving    Time  8    Period  Weeks    Status  New            Plan - 09/01/18 1431    Clinical Impression Statement  Pt is a 63 y.o. female who presents to outpatient rehab s/p Lt basal ganglia infarct on 08/28/18 w/ decreased strength and coordination RUE, changes in vision, cognitive deficits including awareness, attention, memory and higher level cognition (executive functioning). Pt also may have language component (aphasia),  and dysphagia and dysarthria.    OT Occupational Profile and History  Detailed Assessment- Review of Records and additional review of physical, cognitive, psychosocial history related to current functional performance    Occupational performance deficits  (Please refer to evaluation for details):  ADL's;IADL's;Work;Play;Leisure    Pt will benefit  from skilled therapeutic intervention in order to improve on the following performance deficits  Body Structure / Function / Physical Skills;Cognitive Skills    Body Structure / Function / Physical Skills  ADL;Decreased knowledge of precautions;ROM;UE functional use;Balance;Decreased knowledge of use of DME;FMC;Mobility;Dexterity;Vision;Endurance;Strength;IADL;Coordination    Cognitive Skills  Attention;Perception;Problem Solve;Safety Awareness;Sequencing;Memory;Thought;Understand    Rehab Potential  Good    Clinical Decision Making  Several treatment options, min-mod task modification necessary    Comorbidities Affecting Occupational Performance:  May have comorbidities impacting occupational performance    Modification or Assistance to Complete Evaluation   Min-Moderate modification of tasks or assist with assess necessary to complete eval    OT Frequency  3x / week    OT Duration  8 weeks   plus eval (may only see 2x/wk depending on insurance visit limits and transportation)   OT Treatment/Interventions  Self-care/ADL training;Therapeutic exercise;Visual/perceptual remediation/compensation;Coping strategies training;Neuromuscular education;Patient/family education;Building services engineer;Therapeutic activities;DME and/or AE instruction;Manual Therapy;Passive range of motion;Cognitive remediation/compensation;Moist Heat    Plan  HEP for high level ROM, low range strengthening and coordination RUE, further assess vision and MOCA if time - may need to adjust cognitive goals based on this    Consulted and Agree with Plan of Care  Patient;Family member/caregiver    Family Member Consulted  daughter       Patient will benefit from skilled therapeutic intervention in order to improve the following deficits and impairments:     Visit Diagnosis: Muscle weakness (generalized) - Plan: Ot plan of care  cert/re-cert  Other symptoms and signs involving cognitive functions following cerebral infarction - Plan: Ot plan of care cert/re-cert  Unsteadiness on feet - Plan: Ot plan of care cert/re-cert  Other lack of coordination - Plan: Ot plan of care cert/re-cert  Visuospatial deficit    Problem List Patient Active Problem List   Diagnosis Date Noted  . CVA (cerebral vascular accident) (HCC) 08/29/2018  . Hyperlipidemia 08/29/2018  . Stroke (HCC) 08/28/2018  . Cerebrovascular accident (CVA) (HCC) 04/26/2015  . Essential (primary) hypertension 04/26/2015  . 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 Compulsive tobacco user syndrome 04/26/2015    Kelli Churn, OTR/L 09/01/2018, 2:51 PM  [facility] [address] [place], [place], [address] Phone: [phone]   Fax:  [phone]  Name: Laurie Valenzuela MRN: [id number] Date of Birth: [birth year]
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
## 2018-09-01 NOTE — Therapy (Signed)
[facility] [address] [place], [place], [address] Phone: [phone]   Fax:  [phone]  Physical Therapy Evaluation  Patient Details  Name: Laurie Valenzuela MRN: [id number] Date of Birth: 1956/02/02 Referring Provider (PT): Zigmund Daniel., MD (hospitalist)   Encounter Date: 09/01/2018  PT End of Session - 09/01/18 1106    Visit Number  1    Number of Visits  9    Date for PT Re-Evaluation  10/01/18    Authorization Type  [employer]; no other appt notes    PT Start Time  1106    PT Stop Time  1145    PT Time Calculation (min)  39 min    Activity Tolerance  Patient tolerated treatment well    Behavior During Therapy  [facility] for tasks assessed/performed       Past Medical History:  Diagnosis Date  . HTN (hypertension)   . Hyperlipidemia   . Hypothyroidism   . Stroke [facility])    2017    Past Surgical History:  Procedure Laterality Date  . CESAREAN SECTION      There were no vitals filed for this visit.   Subjective Assessment - 09/01/18 1109    Subjective  Walking is going so-so. Denies falls or even close calls.     Patient is accompained by:  Family member   Laurie Valenzuela, daughter   Pertinent History  CVA, HTN, tobacco use    Diagnostic tests  MRI left basal ganglia infarct    Patient Stated Goals  improve balance and walking    Currently in Pain?  No/denies         [facility] PT Assessment - 09/01/18 1110      Assessment   Medical Diagnosis  Lt CVA (Basal Ganglia)    Referring Provider (PT)  Zigmund Daniel., MD (hospitalist)    Onset Date/Surgical Date  08/28/18    Hand Dominance  Right    Prior Therapy  acute PT/OT/SLP      Precautions   Precautions  Fall    Precaution Comments  nectar thick diet d/t dysphagia, no driving, 24 hr. supervision right now      Balance Screen   Has the patient fallen in the past 6 months  No    Has the patient had a decrease in activity level  because of a fear of falling?   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 [place]    Leisure  Greenhouse/gardening; has a Nurse, mental health at home      Cognition   Overall Cognitive Status  Within Functional Limits for tasks assessed   daughter reports only occ word-finding issues     Observation/Other Assessments   Observations  noted facial weakness    Focus  on Therapeutic Outcomes (FOTO)   not set up      Sensation   Light Touch  Appears Intact      Coordination   Gross Motor Movements are Fluid and Coordinated  Yes    Fine Motor Movements are Fluid and Coordinated  Yes    Heel Shin Test  WNL      Posture/Postural Control   Posture/Postural Control  No significant limitations      ROM / Strength   AROM / PROM / Strength  AROM;Strength      AROM   Overall AROM   Within functional limits for tasks performed   bil LEs     Strength   Overall Strength  Deficits    Overall Strength Comments  Rt knee extension 4/5, right hip abduction 4+/5; hip extension 5/5; left knee extension 4+, hip abdct 4/5; hip extension 4+, bil ankle DF 5/5      Bed Mobility   Bed Mobility  Rolling Right;Rolling Left;Supine to Sit;Sit to Supine    Rolling Right  Independent    Rolling Left  Independent    Supine to Sit  Independent    Sit to Supine  Independent      Transfers   Transfers  Sit to Stand;Stand to Sit    Sit to Stand  6: Modified independent (Device/Increase  time);With upper extremity assist;With armrests;From chair/3-in-1;From bed    Five time sit to stand comments   15.84   with bil UE assist; chair with armrests; >12 sec incr fall r   Stand to Sit  6: Modified independent (Device/Increase time);With upper extremity assist      Ambulation/Gait   Ambulation/Gait  Yes    Ambulation/Gait Assistance  5: Supervision    Ambulation Distance (Feet)  60 Feet   75, 80   Assistive device  None    Gait Pattern  Step-through pattern;Decreased arm swing - right;Decreased stance time - right;Decreased weight shift to right;Lateral trunk lean to left;Wide base of support    Gait velocity  32.8/10.56=3.10 ft/sec       Functional Gait  Assessment   Gait assessed   Yes    Gait Level Surface  Walks 20 ft in less than 5.5 sec, no assistive devices, good speed, no evidence for imbalance, normal gait pattern, deviates no more than 6 in outside of the 12 in walkway width.    Change in Gait Speed  Able to change speed, demonstrates mild gait deviations, deviates 6-10 in outside of the 12 in walkway width, or no gait deviations, unable to achieve a major change in velocity, or uses a change in velocity, or uses an assistive device.    Gait with Horizontal Head Turns  Performs head turns smoothly with no change in gait. Deviates no more than 6 in outside 12 in walkway width    Gait with Vertical Head Turns  Performs task with slight change in gait velocity (eg, minor disruption to smooth gait path), deviates 6 - 10 in outside 12 in walkway width or uses assistive device    Gait and Pivot Turn  Pivot turns safely within 3 sec and stops quickly with no loss of balance.    Step Over Obstacle  Is able to step over one shoe box (4.5 in total height) without changing gait speed. No evidence of imbalance.    Gait with Narrow Base of Support  Ambulates 4-7 steps.   6 steps   Gait with Eyes Closed  Walks 20  ft, uses assistive device, slower speed, mild gait deviations, deviates  6-10 in outside 12 in walkway width. Ambulates 20 ft in less than 9 sec but greater than 7 sec.    Ambulating Backwards  Walks 20 ft, no assistive devices, good speed, no evidence for imbalance, normal gait    Steps  Alternating feet, must use rail.    Total Score  23    FGA comment:  <23 indicates incr fall risk                Objective measurements completed on examination: See above findings.              PT Education - 09/01/18 1700    Education Details  results of PT evaluation; remains incr fall risk by one of 3 measures; PT POC    Person(s) Educated  Patient;Child(ren)    Methods  Explanation    Comprehension  Verbalized understanding          PT Long Term Goals - 09/01/18 2314      PT LONG TERM GOAL #1   Title  Patient will be independent with HEP for balance and strengthening and aerobic conditioning. (Target all LTGs 10/01/18)    Time  4    Period  Weeks    Status  New      PT LONG TERM GOAL #2   Title  Patient will perform sit to stand from 20" surface without UE assist demonstrating improved LE strength and balance.     Time  4    Period  Weeks    Status  New      PT LONG TERM GOAL #3   Title  Pt will perform 5xSTS with use of UEs (as on eval) in <12 seconds    Time  4    Period  Weeks    Status  New      PT LONG TERM GOAL #4   Title  Patient will score >=25/30 on FGA    Time  4    Period  Weeks    Status  New      PT LONG TERM GOAL #5   Title  Patient will ambulate over paved outdoor surfaces, including ramps and curbs, modified independent with LRAD    Time  4    Period  Weeks    Status  New             Plan - 09/01/18 1700    Clinical Impression Statement  Patient referred for OPPT s/p acute left basal ganglia CVA 08/28/18 with resulting impaired balance and gait. Patient scored 23/30 on FGA and 5x sit to stand required 15.84 sec (with >12 sec indicative of incr fall risk). Patient has history of previous CVA with some gait  disturbance prior to most recent CVA, however daughter indicates she is not yet close to her baseline. Patient can benefit from PT to address these deficits and the deficits listed below via the interventions listed below.     Personal Factors and Comorbidities  Comorbidity 3+;Fitness    Comorbidities  HTN, tobacco use, HLD    Examination-Activity Limitations  Carry;Bathing;Locomotion Level;Squat;Stairs;Transfers    Examination-Participation Restrictions  Engineer, drilling;Shop;Yard Work    Stability/Clinical Decision Making  Stable/Uncomplicated    Clinical Decision Making  Low    Rehab Potential  Good    PT Frequency  2x / week    PT Duration  4 weeks    PT Treatment/Interventions  ADLs/Self Care  Home Management;DME Instruction;Gait training;Stair training;Functional mobility training;Therapeutic activities;Therapeutic exercise;Balance training;Patient/family education;Orthotic Fit/Training;Neuromuscular re-education;Passive range of motion    PT Next Visit Plan  6 minute walk test; prescribe walking program; initiate HEP for balance    PT Home Exercise Plan  pt does not normally exercise; used to enjoy swimming    Consulted and Agree with Plan of Care  Patient;Family member/caregiver    Family Member Consulted  daughter, Laurie Valenzuela       Patient will benefit from skilled therapeutic intervention in order to improve the following deficits and impairments:  Abnormal gait, Decreased balance, Decreased knowledge of use of DME, Decreased mobility, Decreased strength  Visit Diagnosis: Muscle weakness (generalized) - Plan: PT plan of care cert/re-cert  Unsteadiness on feet - Plan: PT plan of care cert/re-cert  Other abnormalities of gait and mobility - Plan: PT plan of care cert/re-cert  Other symptoms and signs involving the nervous system - Plan: PT plan of care cert/re-cert     Problem List Patient Active Problem List   Diagnosis Date Noted  . Tibia/fibula  fracture 09/01/2018  . CVA (cerebral vascular accident) (HCC) 08/29/2018  . Hyperlipidemia 08/29/2018  . Stroke (HCC) 08/28/2018  . Cerebrovascular accident (CVA) (HCC) 04/26/2015  . Essential (primary) hypertension 04/26/2015  . 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 Compulsive tobacco user syndrome 04/26/2015    Zena Amos, PT 09/01/2018, 11:22 PM  [facility] [address] [place], [place], [address] Phone: [phone]   Fax:  [phone]  Name: Laurie Valenzuela MRN: [id number] Date of Birth: [birth year]

## 2018-09-02 ENCOUNTER — Inpatient Hospital Stay (HOSPITAL_COMMUNITY)
Admission: AD | Admit: 2018-09-02 | Discharge: 2018-09-11 | DRG: 493 | Disposition: A | Discharge status: Skilled Nursing Facility | Payer: Managed Care, Other (non HMO) | Source: Other Acute Inpatient Hospital | Attending: Orthopedic Surgery | Admitting: Orthopedic Surgery

## 2018-09-02 DIAGNOSIS — S82201A Unspecified fracture of shaft of right tibia, initial encounter for closed fracture: Secondary | ICD-10-CM | POA: Diagnosis present

## 2018-09-02 DIAGNOSIS — Z7982 Long term (current) use of aspirin: Secondary | ICD-10-CM | POA: Diagnosis not present

## 2018-09-02 DIAGNOSIS — S82401A Unspecified fracture of shaft of right fibula, initial encounter for closed fracture: Secondary | ICD-10-CM | POA: Diagnosis not present

## 2018-09-02 DIAGNOSIS — Z79899 Other long term (current) drug therapy: Secondary | ICD-10-CM | POA: Diagnosis not present

## 2018-09-02 DIAGNOSIS — D62 Acute posthemorrhagic anemia: Secondary | ICD-10-CM | POA: Diagnosis not present

## 2018-09-02 DIAGNOSIS — E8889 Other specified metabolic disorders: Secondary | ICD-10-CM | POA: Diagnosis not present

## 2018-09-02 DIAGNOSIS — E785 Hyperlipidemia, unspecified: Secondary | ICD-10-CM | POA: Diagnosis not present

## 2018-09-02 DIAGNOSIS — I639 Cerebral infarction, unspecified: Secondary | ICD-10-CM | POA: Diagnosis present

## 2018-09-02 DIAGNOSIS — I10 Essential (primary) hypertension: Secondary | ICD-10-CM | POA: Diagnosis not present

## 2018-09-02 DIAGNOSIS — E039 Hypothyroidism, unspecified: Secondary | ICD-10-CM | POA: Diagnosis present

## 2018-09-02 DIAGNOSIS — W1830XA Fall on same level, unspecified, initial encounter: Secondary | ICD-10-CM | POA: Diagnosis not present

## 2018-09-02 DIAGNOSIS — M810 Age-related osteoporosis without current pathological fracture: Secondary | ICD-10-CM | POA: Diagnosis present

## 2018-09-02 DIAGNOSIS — S82241A Displaced spiral fracture of shaft of right tibia, initial encounter for closed fracture: Secondary | ICD-10-CM | POA: Diagnosis not present

## 2018-09-02 DIAGNOSIS — Z8673 Personal history of transient ischemic attack (TIA), and cerebral infarction without residual deficits: Secondary | ICD-10-CM

## 2018-09-02 DIAGNOSIS — Z87891 Personal history of nicotine dependence: Secondary | ICD-10-CM | POA: Diagnosis not present

## 2018-09-02 DIAGNOSIS — T148XXA Other injury of unspecified body region, initial encounter: Secondary | ICD-10-CM

## 2018-09-02 DIAGNOSIS — M79661 Pain in right lower leg: Secondary | ICD-10-CM | POA: Diagnosis present

## 2018-09-02 DIAGNOSIS — Z7902 Long term (current) use of antithrombotics/antiplatelets: Secondary | ICD-10-CM | POA: Diagnosis not present

## 2018-09-02 DIAGNOSIS — F17200 Nicotine dependence, unspecified, uncomplicated: Secondary | ICD-10-CM | POA: Diagnosis present

## 2018-09-02 LAB — BASIC METABOLIC PANEL
Anion gap: 10 (ref 5–15)
BUN: 19 mg/dL (ref 8–23)
CO2: 28 mmol/L (ref 22–32)
Calcium: 9.2 mg/dL (ref 8.9–10.3)
Chloride: 104 mmol/L (ref 98–111)
Creatinine, Ser: 0.92 mg/dL (ref 0.44–1.00)
GFR calc Af Amer: >60 mL/min (ref >60)
GLUCOSE: 133 mg/dL — AB (ref 70–99)
Potassium: 3.4 mmol/L — ABNORMAL LOW (ref 3.5–5.1)
Sodium: 142 mmol/L (ref 135–145)

## 2018-09-02 LAB — CBC
HCT: 38 % (ref 36.0–46.0)
Hemoglobin: 12.5 g/dL (ref 12.0–15.0)
MCH: 31.3 pg (ref 26.0–34.0)
MCHC: 32.9 g/dL (ref 30.0–36.0)
MCV: 95.2 fL (ref 80.0–100.0)
Platelets: 239 10*3/uL (ref 150–400)
RBC: 3.99 MIL/uL (ref 3.87–5.11)
RDW: 12 % (ref 11.5–15.5)
WBC: 10 10*3/uL (ref 4.0–10.5)
nRBC: 0 % (ref 0.0–0.2)

## 2018-09-02 MED ORDER — CEPHALEXIN 500 MG PO CAPS
500.0000 mg | ORAL_CAPSULE | Freq: Four times a day (QID) | ORAL | Status: DC
  Administered 2018-09-02 (×2): 500 mg via ORAL
  Filled 2018-09-02 (×2): qty 1

## 2018-09-02 MED ORDER — CEPHALEXIN 500 MG PO CAPS: 500.0000 mg | ORAL_CAPSULE | Freq: Four times a day (QID) | ORAL | 0 refills | Status: DC

## 2018-09-02 MED ORDER — SODIUM CHLORIDE 0.9 % IV SOLN
1.0000 g | INTRAVENOUS | Status: DC
  Administered 2018-09-02: 1 g via INTRAVENOUS
  Filled 2018-09-02: qty 1

## 2018-09-02 NOTE — Progress Notes (Signed)
Patient arrived via stretcher from [facility]. 3 assist personnel to transfer patient from bed to bed. Patient appears very tired. Oriented to room

## 2018-09-02 NOTE — Discharge Summary (Signed)
[facility] [place] [facility]   PATIENT NAME: Laurie Valenzuela    MR#:  [id number]  DATE OF BIRTH:  12-07-1955  DATE OF ADMISSION:  09/01/2018 ADMITTING PHYSICIAN: Alford Highland, MD  DATE OF DISCHARGE: 09/02/2018  PRIMARY CARE PHYSICIAN: No primary care provider on file.    ADMISSION DIAGNOSIS:  Shortness of breath [R06.02] Other closed fracture of distal end of right tibia, initial encounter [S82.391A]  DISCHARGE DIAGNOSIS:   Acute oblique fractures of the distal tibia and proximal fibula s/p mechanical fall--pt transferring to [facility] for further management SECONDARY DIAGNOSIS:   Past Medical History:  Diagnosis Date  . HTN (hypertension)   . Hyperlipidemia   . Hypothyroidism   . Stroke [facility])    2017    HOSPITAL COURSE:   Laurie Valenzuela  is a 63 y.o. female with a known history of recent stroke on Friday from [facility].  She has right-sided weakness and trouble swallowing.  She was doing outpatient physical therapy today and doing well.  She was getting into the car and twisted her leg and slipped and fell.  She has severe pain in her lower leg.  She was found to have a tibia-fibula fracture distally  1.  Preoperative consultation for Acute right tib-fib fracture.   -Patient is a higher risk patient for low risk procedure with her recent stroke and being on blood thinners.  She is on aspirin and Plavix.--- now held in anticipation for surgery -Patient was seen by Dr. Cassell Smiles orthopedic-- daughter Isabelle Course and patient requesting transfer to [facility]. Dr. Odis Luster -Dr. Carola Frost at [facility] is excepting orthopedic physician. Court care linked called and bed request placed. -Holding aspirin and Plavix per Dr. Odis Luster recommendation -please resume aspirin Plavix after surgery given recent stroke -patient ate breakfast this morning. She is NPO for now  2.  Recent stroke  -on aspirin and Plavix and Lipitor.  3.  Essential  hypertension on lisinopril  4.  Hyperlipidemia unspecified on Lipitor  5.  Former smoker on nicotine patch  Transfer to [facility] once bed opens up. Daughter informed on the phone.   CONSULTS OBTAINED:    DRUG ALLERGIES:  No Known Allergies  DISCHARGE MEDICATIONS:   Allergies as of 09/02/2018   No Known Allergies     Medication List    STOP taking these medications   aspirin 81 MG chewable tablet   clopidogrel 75 MG tablet Commonly known as:  PLAVIX   nicotine 21 mg/24hr patch Commonly known as:  NICODERM CQ - dosed in mg/24 hours     TAKE these medications   atorvastatin 40 MG tablet Commonly known as:  LIPITOR Take 1 tablet (40 mg total) by mouth daily at 6 PM for 30 days.   cephALEXin 500 MG capsule Commonly known as:  KEFLEX Take 1 capsule (500 mg total) by mouth every 6 (six) hours.   levothyroxine 175 MCG tablet Commonly known as:  SYNTHROID, LEVOTHROID Take 1 tablet by mouth daily.   lisinopril 10 MG tablet Commonly known as:  PRINIVIL,ZESTRIL Take 1 tablet (10 mg total) by mouth daily for 30 days.   multivitamin with minerals tablet Take 1 tablet by mouth daily.   RESOURCE THICKENUP CLEAR Powd Use to make liquids nectar thick as needed       If you experience worsening of your admission symptoms, develop shortness of breath, life threatening emergency, suicidal or homicidal thoughts you must seek medical attention immediately by calling 911 or  calling your MD immediately  if symptoms less severe.  You Must read complete instructions/literature along with all the possible adverse reactions/side effects for all the Medicines you take and that have been prescribed to you. Take any new Medicines after you have completely understood and accept all the possible adverse reactions/side effects.   Please note  You were cared for by a hospitalist during your hospital stay. If you have any questions about your discharge medications or the care you received  while you were in the hospital after you are discharged, you can call the unit and asked to speak with the hospitalist on call if the hospitalist that took care of you is not available. Once you are discharged, your primary care physician will handle any further medical issues. Please note that NO REFILLS for any discharge medications will be authorized once you are discharged, as it is imperative that you return to your primary care physician (or establish a relationship with a primary care physician if you do not have one) for your aftercare needs so that they can reassess your need for medications and monitor your lab values. Today   SUBJECTIVE   Complains of leg pain. Had a mechanical fall yesterday after trying to get in the car.  VITAL SIGNS:  Blood pressure 123/79, pulse 66, temperature 98.7 F (37.1 C), temperature source Oral, resp. rate 17, height  (1.727 m), weight 102 kg, SpO2 94 %.  I/O:    Intake/Output Summary (Last 24 hours) at 09/02/2018 1042 Last data filed at 09/02/2018 0509 Gross per 24 hour  Intake -  Output 750 ml  Net -750 ml    PHYSICAL EXAMINATION:  GENERAL:  63 y.o.-year-old patient lying in the bed with no acute distress.  EYES: Pupils equal, round, reactive to light and accommodation. No scleral icterus. Extraocular muscles intact.  HEENT: Head atraumatic, normocephalic. Oropharynx and nasopharynx clear.  NECK:  Supple, no jugular venous distention. No thyroid enlargement, no tenderness.  LUNGS: Normal breath sounds bilaterally, no wheezing, rales,rhonchi or crepitation. No use of accessory muscles of respiration.  CARDIOVASCULAR: S1, S2 normal. No murmurs, rubs, or gallops.  ABDOMEN: Soft, non-tender, non-distended. Bowel sounds present. No organomegaly or mass.  EXTREMITIES: No pedal edema, cyanosis, or clubbing. Decreased range of motion of the right ankle NEUROLOGIC: Cranial nerves II through XII are intact. Muscle strength 4/5 in all extremities.  Sensation intact. Gait not checked. Facial droop present PSYCHIATRIC: The patient is alert and oriented x 3.  SKIN: No obvious rash, lesion, or ulcer.   DATA REVIEW:   CBC  Recent Labs  Lab 09/02/18 0421  WBC 10.0  HGB 12.5  HCT 38.0  PLT 239    Chemistries  Recent Labs  Lab 09/01/18 1630 09/02/18 0421  NA 140 142  K 4.0 3.4*  CL 105 104  CO2 27 28  GLUCOSE 109* 133*  BUN 18 19  CREATININE 0.69 0.92  CALCIUM 9.1 9.2  AST 29  --   ALT 32  --   ALKPHOS 74  --   BILITOT 0.6  --     Microbiology Results   Recent Results (from the past 240 hour(s))  Surgical pcr screen     Status: None   Collection Time: 09/01/18  7:46 PM  Result Value Ref Range Status   MRSA, PCR NEGATIVE NEGATIVE Final   Staphylococcus aureus NEGATIVE NEGATIVE Final    Comment: (NOTE) The Xpert SA Assay (FDA approved for NASAL specimens in patients 22 years of  age and older), is one component of a comprehensive surveillance program. It is not intended to diagnose infection nor to guide or monitor treatment. Performed at [facility], [address] [place], [place] [address]     RADIOLOGY:  Dg Tibia/fibula Right  Result Date: 09/01/2018 CLINICAL DATA:  Right lower leg pain. EXAM: RIGHT TIBIA AND FIBULA - 2 VIEW COMPARISON:  None. FINDINGS: Obliquely oriented, nondisplaced fracture involving the proximal fibula is identified. Oblique fracture involving the distal diaphysis of the tibia is also noted. There is mild lateral and posterior displacement of the distal fracture fragments. IMPRESSION: 1. Acute oblique fractures of the distal tibia and proximal fibula. Electronically Signed   By: Signa Kell M.D.   On: 09/01/2018 15:54   Dg Chest Port 1 View  Result Date: 09/01/2018 CLINICAL DATA:  Right knee pain after fall trying to get into car today. Residual right-sided pain and left facial droop. EXAM: PORTABLE CHEST 1 VIEW COMPARISON:  None. FINDINGS: Lungs are adequately inflated  without focal airspace consolidation or effusion. Minimal prominence of the central bronchovascular markings which may be due to mild vascular congestion. Borderline cardiomegaly. Remaining bony structures are unremarkable. Mild prominence of the upper lying soft tissues. IMPRESSION: Possible mild vascular congestion. Electronically Signed   By: Elberta Fortis M.D.   On: 09/01/2018 18:15     CODE STATUS:     Code Status Orders  (From admission, onward)         Start     Ordered   09/01/18 1730  Full code  Continuous     09/01/18 1730        Code Status History    Date Active Date Inactive Code Status Order ID Comments User Context   08/28/2018 1756 08/30/2018 2105 Full Code [id number]  Noralee Stain, DO Inpatient      TOTAL TIME TAKING CARE OF THIS PATIENT: *40* minutes.    Enedina Finner M.D on 09/02/2018 at 10:42 AM  Between 7am to 6pm - Pager - [phone] After 6pm go to [email or web address] - Social research officer, government  [facility]  Office  [phone]  CC: Primary care physician; No primary care provider on file.

## 2018-09-02 NOTE — Consult Note (Signed)
Patient and daughter requesting transfer to [facility] for definitive treatment. Detailed consult note to follow.

## 2018-09-02 NOTE — Progress Notes (Signed)
Dr. Sheryle Hail notified about U/A results. Awaiting for new orders.
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
## 2018-09-02 NOTE — Progress Notes (Signed)
SLP Cancellation Note  Patient Details Name: Laurie Valenzuela MRN: [id number] DOB: [birth year]   Cancelled treatment:       Reason Eval/Treat Not Completed: Other (comment)(Per records, patient needs MBS before changing diet)  Dollene Primrose, MS/CCC- SLP  Leandrew Koyanagi 09/02/2018, 1:46 PM
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
## 2018-09-02 NOTE — Anesthesia Preprocedure Evaluation (Signed)
Anesthesia Evaluation  Patient identified by MRN, date of birth, ID band Patient awake    Reviewed: Allergy & Precautions, H&P , NPO status , Patient's Chart, lab work & pertinent test results, reviewed documented beta blocker date and time   Airway Mallampati: II  TM Distance: >3 FB Neck ROM: full    Dental no notable dental hx.    Pulmonary former smoker,    Pulmonary exam normal breath sounds clear to auscultation       Cardiovascular Exercise Tolerance: Good hypertension, Pt. on medications negative cardio ROS   Rhythm:regular Rate:Normal  Echo 2/20 The left ventricle has hyperdynamic systolic function, with an ejection fraction of >65%. The cavity size was normal. Left ventricular diastolic Doppler parameters are consistent with impaired relaxation.   Neuro/Psych CVA, Residual Symptoms negative psych ROS   GI/Hepatic negative GI ROS, Neg liver ROS,   Endo/Other  Hypothyroidism   Renal/GU negative Renal ROS  negative genitourinary   Musculoskeletal negative musculoskeletal ROS (+)   Abdominal   Peds  Hematology negative hematology ROS (+)   Anesthesia Other Findings   Reproductive/Obstetrics negative OB ROS                             Anesthesia Physical Anesthesia Plan  ASA: III  Anesthesia Plan: General   Post-op Pain Management:    Induction:   PONV Risk Score and Plan: 3 and Ondansetron and Treatment may vary due to age or medical condition  Airway Management Planned: Oral ETT and LMA  Additional Equipment:   Intra-op Plan:   Post-operative Plan:   Informed Consent: I have reviewed the patients History and Physical, chart, labs and discussed the procedure including the risks, benefits and alternatives for the proposed anesthesia with the patient or authorized representative who has indicated his/her understanding and acceptance.     Dental Advisory Given  Plan  Discussed with: CRNA, Anesthesiologist and Surgeon  Anesthesia Plan Comments: (Recent CVA with residuals )        Anesthesia Quick Evaluation

## 2018-09-02 NOTE — Plan of Care (Signed)
Patient Laurie Valenzuela. Tele removed. [facility] arrived to transport to [facility] [address].  Patient left NPO. No consent completed, since no orders yet.  Patient's pain under control before transport.  Family contacted to inform that patient was being transferred and to what room.  Report called and s/w Darlina Sicilian, RN.  Last time ASA and Plavix given was 3/4 AM.  RN assessment and VS revealed stability.

## 2018-09-02 NOTE — Discharge Instructions (Signed)
Patient to resume ASA and Plavix after ortho surgery

## 2018-09-03 ENCOUNTER — Encounter (HOSPITAL_COMMUNITY)
Admission: AD | Disposition: A | Discharge status: Skilled Nursing Facility | Payer: Self-pay | Source: Other Acute Inpatient Hospital | Attending: Orthopedic Surgery

## 2018-09-03 ENCOUNTER — Inpatient Hospital Stay (HOSPITAL_COMMUNITY): Payer: Managed Care, Other (non HMO) | Admitting: Certified Registered Nurse Anesthetist

## 2018-09-03 ENCOUNTER — Inpatient Hospital Stay (HOSPITAL_COMMUNITY): Payer: Managed Care, Other (non HMO)

## 2018-09-03 ENCOUNTER — Encounter (HOSPITAL_COMMUNITY): Payer: Self-pay | Admitting: Certified Registered Nurse Anesthetist

## 2018-09-03 ENCOUNTER — Other Ambulatory Visit: Payer: Self-pay

## 2018-09-03 DIAGNOSIS — M810 Age-related osteoporosis without current pathological fracture: Secondary | ICD-10-CM | POA: Diagnosis present

## 2018-09-03 DIAGNOSIS — W1830XA Fall on same level, unspecified, initial encounter: Secondary | ICD-10-CM | POA: Diagnosis present

## 2018-09-03 DIAGNOSIS — Z7902 Long term (current) use of antithrombotics/antiplatelets: Secondary | ICD-10-CM | POA: Diagnosis not present

## 2018-09-03 DIAGNOSIS — S82201A Unspecified fracture of shaft of right tibia, initial encounter for closed fracture: Secondary | ICD-10-CM | POA: Diagnosis present

## 2018-09-03 DIAGNOSIS — E039 Hypothyroidism, unspecified: Secondary | ICD-10-CM | POA: Diagnosis present

## 2018-09-03 DIAGNOSIS — S82241A Displaced spiral fracture of shaft of right tibia, initial encounter for closed fracture: Secondary | ICD-10-CM | POA: Diagnosis present

## 2018-09-03 DIAGNOSIS — Z79899 Other long term (current) drug therapy: Secondary | ICD-10-CM | POA: Diagnosis not present

## 2018-09-03 DIAGNOSIS — S82401A Unspecified fracture of shaft of right fibula, initial encounter for closed fracture: Secondary | ICD-10-CM | POA: Diagnosis present

## 2018-09-03 DIAGNOSIS — E785 Hyperlipidemia, unspecified: Secondary | ICD-10-CM | POA: Diagnosis present

## 2018-09-03 DIAGNOSIS — Z8673 Personal history of transient ischemic attack (TIA), and cerebral infarction without residual deficits: Secondary | ICD-10-CM | POA: Diagnosis not present

## 2018-09-03 DIAGNOSIS — Z87891 Personal history of nicotine dependence: Secondary | ICD-10-CM | POA: Diagnosis not present

## 2018-09-03 DIAGNOSIS — E8889 Other specified metabolic disorders: Secondary | ICD-10-CM | POA: Diagnosis present

## 2018-09-03 DIAGNOSIS — Z7982 Long term (current) use of aspirin: Secondary | ICD-10-CM | POA: Diagnosis not present

## 2018-09-03 DIAGNOSIS — D62 Acute posthemorrhagic anemia: Secondary | ICD-10-CM | POA: Diagnosis not present

## 2018-09-03 DIAGNOSIS — I10 Essential (primary) hypertension: Secondary | ICD-10-CM | POA: Diagnosis present

## 2018-09-03 DIAGNOSIS — M79661 Pain in right lower leg: Secondary | ICD-10-CM | POA: Diagnosis present

## 2018-09-03 HISTORY — PX: TIBIA IM NAIL INSERTION: SHX2516

## 2018-09-03 LAB — SURGICAL PCR SCREEN
MRSA, PCR: NEGATIVE
Staphylococcus aureus: NEGATIVE

## 2018-09-03 LAB — CBC
HEMATOCRIT: 40.1 % (ref 36.0–46.0)
Hemoglobin: 12.8 g/dL (ref 12.0–15.0)
MCH: 31.3 pg (ref 26.0–34.0)
MCHC: 31.9 g/dL (ref 30.0–36.0)
MCV: 98 fL (ref 80.0–100.0)
Platelets: 232 10*3/uL (ref 150–400)
RBC: 4.09 MIL/uL (ref 3.87–5.11)
RDW: 12 % (ref 11.5–15.5)
WBC: 14.9 10*3/uL — ABNORMAL HIGH (ref 4.0–10.5)
nRBC: 0 % (ref 0.0–0.2)

## 2018-09-03 LAB — CREATININE, SERUM
Creatinine, Ser: 0.83 mg/dL (ref 0.44–1.00)
GFR calc Af Amer: >60 mL/min (ref >60)
GFR calc non Af Amer: >60 mL/min (ref >60)

## 2018-09-03 SURGERY — INSERTION, INTRAMEDULLARY ROD, TIBIA
Anesthesia: General | Site: Leg Lower | Laterality: Right

## 2018-09-03 MED ORDER — ATORVASTATIN CALCIUM 40 MG PO TABS
40.0000 mg | ORAL_TABLET | Freq: Every day | ORAL | Status: DC
  Administered 2018-09-03 – 2018-09-10 (×8): 40 mg via ORAL
  Filled 2018-09-03 (×9): qty 1

## 2018-09-03 MED ORDER — SODIUM CHLORIDE 0.9 % IV SOLN
INTRAVENOUS | Status: DC | PRN
  Administered 2018-09-03: 25 ug/min via INTRAVENOUS

## 2018-09-03 MED ORDER — LACTATED RINGERS IV SOLN
INTRAVENOUS | Status: DC
  Administered 2018-09-03: 01:00:00 via INTRAVENOUS

## 2018-09-03 MED ORDER — ACETAMINOPHEN 160 MG/5ML PO SOLN: 325.0000 mg | ORAL | Status: DC | PRN

## 2018-09-03 MED ORDER — SODIUM CHLORIDE 0.9 % IV SOLN
1.0000 g | INTRAVENOUS | Status: DC
  Administered 2018-09-03 – 2018-09-07 (×5): 1 g via INTRAVENOUS
  Filled 2018-09-03 (×5): qty 10

## 2018-09-03 MED ORDER — METHOCARBAMOL 500 MG PO TABS: 500.0000 mg | ORAL_TABLET | Freq: Four times a day (QID) | ORAL | Status: DC | PRN

## 2018-09-03 MED ORDER — OXYCODONE HCL 5 MG PO TABS: 5.0000 mg | ORAL_TABLET | Freq: Once | ORAL | Status: DC | PRN

## 2018-09-03 MED ORDER — LISINOPRIL 10 MG PO TABS
10.0000 mg | ORAL_TABLET | Freq: Every day | ORAL | Status: DC
  Administered 2018-09-03 – 2018-09-11 (×9): 10 mg via ORAL
  Filled 2018-09-03 (×9): qty 1

## 2018-09-03 MED ORDER — ONDANSETRON HCL 4 MG/2ML IJ SOLN
INTRAMUSCULAR | Status: DC | PRN
  Administered 2018-09-03: 4 mg via INTRAVENOUS

## 2018-09-03 MED ORDER — DOCUSATE SODIUM 100 MG PO CAPS
100.0000 mg | ORAL_CAPSULE | Freq: Two times a day (BID) | ORAL | Status: DC
  Administered 2018-09-03 – 2018-09-11 (×15): 100 mg via ORAL
  Filled 2018-09-03 (×16): qty 1

## 2018-09-03 MED ORDER — OXYCODONE HCL 5 MG PO TABS: 5.0000 mg | ORAL_TABLET | ORAL | Status: DC | PRN

## 2018-09-03 MED ORDER — ROCURONIUM BROMIDE 50 MG/5ML IV SOSY
PREFILLED_SYRINGE | INTRAVENOUS | Status: AC
  Filled 2018-09-03: qty 5

## 2018-09-03 MED ORDER — ONDANSETRON HCL 4 MG PO TABS: 4.0000 mg | ORAL_TABLET | Freq: Four times a day (QID) | ORAL | Status: DC | PRN

## 2018-09-03 MED ORDER — HYDROCODONE-ACETAMINOPHEN 5-325 MG PO TABS
1.0000 | ORAL_TABLET | ORAL | Status: DC | PRN
  Administered 2018-09-05 – 2018-09-06 (×2): 2 via ORAL
  Administered 2018-09-06: 1 via ORAL
  Administered 2018-09-07 – 2018-09-10 (×7): 2 via ORAL
  Administered 2018-09-11: 1 via ORAL
  Filled 2018-09-03 (×11): qty 2
  Filled 2018-09-03: qty 1

## 2018-09-03 MED ORDER — DEXAMETHASONE SODIUM PHOSPHATE 10 MG/ML IJ SOLN
INTRAMUSCULAR | Status: AC
  Filled 2018-09-03: qty 1

## 2018-09-03 MED ORDER — ASPIRIN 81 MG PO CHEW
81.0000 mg | CHEWABLE_TABLET | Freq: Every day | ORAL | Status: DC
  Administered 2018-09-03 – 2018-09-11 (×9): 81 mg via ORAL
  Filled 2018-09-03 (×9): qty 1

## 2018-09-03 MED ORDER — POLYETHYLENE GLYCOL 3350 17 G PO PACK
17.0000 g | PACK | Freq: Every day | ORAL | Status: DC
  Administered 2018-09-03 – 2018-09-11 (×9): 17 g via ORAL
  Filled 2018-09-03 (×9): qty 1

## 2018-09-03 MED ORDER — METOCLOPRAMIDE HCL 5 MG/ML IJ SOLN: 5.0000 mg | Freq: Three times a day (TID) | INTRAMUSCULAR | Status: DC | PRN

## 2018-09-03 MED ORDER — POTASSIUM CHLORIDE IN NACL 20-0.9 MEQ/L-% IV SOLN
INTRAVENOUS | Status: DC
  Administered 2018-09-03 – 2018-09-06 (×3): via INTRAVENOUS
  Filled 2018-09-03 (×5): qty 1000

## 2018-09-03 MED ORDER — LACTATED RINGERS IV SOLN
INTRAVENOUS | Status: DC | PRN
  Administered 2018-09-03: 08:00:00 via INTRAVENOUS

## 2018-09-03 MED ORDER — ACETAMINOPHEN 500 MG PO TABS
500.0000 mg | ORAL_TABLET | Freq: Four times a day (QID) | ORAL | Status: AC
  Administered 2018-09-03 – 2018-09-04 (×3): 500 mg via ORAL
  Filled 2018-09-03 (×3): qty 1

## 2018-09-03 MED ORDER — FENTANYL CITRATE (PF) 100 MCG/2ML IJ SOLN
25.0000 ug | INTRAMUSCULAR | Status: DC | PRN
  Administered 2018-09-03 (×2): 50 ug via INTRAVENOUS

## 2018-09-03 MED ORDER — HYDROCODONE-ACETAMINOPHEN 7.5-325 MG PO TABS
1.0000 | ORAL_TABLET | ORAL | Status: DC | PRN
  Administered 2018-09-03 – 2018-09-05 (×6): 1 via ORAL
  Administered 2018-09-07 – 2018-09-09 (×4): 2 via ORAL
  Administered 2018-09-11: 1 via ORAL
  Filled 2018-09-03 (×2): qty 1
  Filled 2018-09-03: qty 2
  Filled 2018-09-03 (×2): qty 1
  Filled 2018-09-03: qty 2
  Filled 2018-09-03 (×2): qty 1
  Filled 2018-09-03: qty 2
  Filled 2018-09-03: qty 1
  Filled 2018-09-03: qty 2

## 2018-09-03 MED ORDER — ACETAMINOPHEN 325 MG PO TABS: 325.0000 mg | ORAL_TABLET | Freq: Four times a day (QID) | ORAL | Status: DC | PRN

## 2018-09-03 MED ORDER — FENTANYL CITRATE (PF) 250 MCG/5ML IJ SOLN
INTRAMUSCULAR | Status: DC | PRN
  Administered 2018-09-03 (×3): 50 ug via INTRAVENOUS
  Administered 2018-09-03: 100 ug via INTRAVENOUS

## 2018-09-03 MED ORDER — MEPERIDINE HCL 50 MG/ML IJ SOLN: 6.2500 mg | INTRAMUSCULAR | Status: DC | PRN

## 2018-09-03 MED ORDER — FENTANYL CITRATE (PF) 250 MCG/5ML IJ SOLN
INTRAMUSCULAR | Status: AC
  Filled 2018-09-03: qty 5

## 2018-09-03 MED ORDER — PROPOFOL 10 MG/ML IV BOLUS
INTRAVENOUS | Status: DC | PRN
  Administered 2018-09-03: 150 mg via INTRAVENOUS

## 2018-09-03 MED ORDER — DEXAMETHASONE SODIUM PHOSPHATE 10 MG/ML IJ SOLN
INTRAMUSCULAR | Status: DC | PRN
  Administered 2018-09-03: 5 mg via INTRAVENOUS

## 2018-09-03 MED ORDER — ENOXAPARIN SODIUM 40 MG/0.4ML ~~LOC~~ SOLN
40.0000 mg | SUBCUTANEOUS | Status: DC
  Administered 2018-09-04 – 2018-09-11 (×8): 40 mg via SUBCUTANEOUS
  Filled 2018-09-03 (×8): qty 0.4

## 2018-09-03 MED ORDER — RESOURCE THICKENUP CLEAR PO POWD
ORAL | Status: DC | PRN
  Filled 2018-09-03: qty 125

## 2018-09-03 MED ORDER — OXYCODONE HCL 5 MG/5ML PO SOLN: 5.0000 mg | Freq: Once | ORAL | Status: DC | PRN

## 2018-09-03 MED ORDER — CLOPIDOGREL BISULFATE 75 MG PO TABS
75.0000 mg | ORAL_TABLET | Freq: Every day | ORAL | Status: DC
  Administered 2018-09-03 – 2018-09-11 (×9): 75 mg via ORAL
  Filled 2018-09-03 (×9): qty 1

## 2018-09-03 MED ORDER — ACETAMINOPHEN 500 MG PO TABS: 1000.0000 mg | ORAL_TABLET | Freq: Four times a day (QID) | ORAL | Status: DC

## 2018-09-03 MED ORDER — DOCUSATE SODIUM 100 MG PO CAPS: 100.0000 mg | ORAL_CAPSULE | Freq: Two times a day (BID) | ORAL | Status: DC

## 2018-09-03 MED ORDER — EPHEDRINE SULFATE-NACL 50-0.9 MG/10ML-% IV SOSY
PREFILLED_SYRINGE | INTRAVENOUS | Status: DC | PRN
  Administered 2018-09-03: 10 mg via INTRAVENOUS

## 2018-09-03 MED ORDER — DIPHENHYDRAMINE HCL 12.5 MG/5ML PO ELIX: 12.5000 mg | ORAL_SOLUTION | ORAL | Status: DC | PRN

## 2018-09-03 MED ORDER — MIDAZOLAM HCL 2 MG/2ML IJ SOLN
INTRAMUSCULAR | Status: AC
  Filled 2018-09-03: qty 2

## 2018-09-03 MED ORDER — METHOCARBAMOL 1000 MG/10ML IJ SOLN
500.0000 mg | Freq: Four times a day (QID) | INTRAVENOUS | Status: DC | PRN
  Filled 2018-09-03: qty 5

## 2018-09-03 MED ORDER — 0.9 % SODIUM CHLORIDE (POUR BTL) OPTIME
TOPICAL | Status: DC | PRN
  Administered 2018-09-03: 1000 mL

## 2018-09-03 MED ORDER — LIDOCAINE 2% (20 MG/ML) 5 ML SYRINGE
INTRAMUSCULAR | Status: AC
  Filled 2018-09-03: qty 5

## 2018-09-03 MED ORDER — CEFAZOLIN SODIUM-DEXTROSE 2-4 GM/100ML-% IV SOLN
INTRAVENOUS | Status: AC
  Filled 2018-09-03: qty 100

## 2018-09-03 MED ORDER — SUGAMMADEX SODIUM 200 MG/2ML IV SOLN
INTRAVENOUS | Status: DC | PRN
  Administered 2018-09-03: 200 mg via INTRAVENOUS

## 2018-09-03 MED ORDER — METHOCARBAMOL 500 MG PO TABS
500.0000 mg | ORAL_TABLET | Freq: Four times a day (QID) | ORAL | Status: DC | PRN
  Administered 2018-09-03 – 2018-09-10 (×5): 500 mg via ORAL
  Filled 2018-09-03 (×5): qty 1

## 2018-09-03 MED ORDER — CEFAZOLIN SODIUM-DEXTROSE 2-3 GM-%(50ML) IV SOLR
INTRAVENOUS | Status: DC | PRN
  Administered 2018-09-03: 2 g via INTRAVENOUS

## 2018-09-03 MED ORDER — PANTOPRAZOLE SODIUM 40 MG PO TBEC
40.0000 mg | DELAYED_RELEASE_TABLET | Freq: Every day | ORAL | Status: DC
  Administered 2018-09-03 – 2018-09-11 (×9): 40 mg via ORAL
  Filled 2018-09-03 (×9): qty 1

## 2018-09-03 MED ORDER — LIDOCAINE 2% (20 MG/ML) 5 ML SYRINGE
INTRAMUSCULAR | Status: DC | PRN
  Administered 2018-09-03: 60 mg via INTRAVENOUS

## 2018-09-03 MED ORDER — ONDANSETRON HCL 4 MG/2ML IJ SOLN: 4.0000 mg | Freq: Once | INTRAMUSCULAR | Status: DC | PRN

## 2018-09-03 MED ORDER — ONDANSETRON HCL 4 MG/2ML IJ SOLN
INTRAMUSCULAR | Status: AC
  Filled 2018-09-03: qty 2

## 2018-09-03 MED ORDER — ONDANSETRON HCL 4 MG/2ML IJ SOLN: 4.0000 mg | Freq: Four times a day (QID) | INTRAMUSCULAR | Status: DC | PRN

## 2018-09-03 MED ORDER — EPHEDRINE 5 MG/ML INJ
INTRAVENOUS | Status: AC
  Filled 2018-09-03: qty 10

## 2018-09-03 MED ORDER — MORPHINE SULFATE (PF) 2 MG/ML IV SOLN
0.5000 mg | INTRAVENOUS | Status: DC | PRN
  Administered 2018-09-03 – 2018-09-04 (×2): 1 mg via INTRAVENOUS
  Filled 2018-09-03 (×2): qty 1

## 2018-09-03 MED ORDER — FENTANYL CITRATE (PF) 100 MCG/2ML IJ SOLN
INTRAMUSCULAR | Status: AC
  Administered 2018-09-03: 50 ug via INTRAVENOUS
  Filled 2018-09-03: qty 2

## 2018-09-03 MED ORDER — METOCLOPRAMIDE HCL 5 MG PO TABS: 5.0000 mg | ORAL_TABLET | Freq: Three times a day (TID) | ORAL | Status: DC | PRN

## 2018-09-03 MED ORDER — MORPHINE SULFATE (PF) 2 MG/ML IV SOLN
2.0000 mg | INTRAVENOUS | Status: DC | PRN
  Administered 2018-09-03 (×2): 2 mg via INTRAVENOUS
  Filled 2018-09-03 (×3): qty 1

## 2018-09-03 MED ORDER — ROCURONIUM BROMIDE 10 MG/ML (PF) SYRINGE
PREFILLED_SYRINGE | INTRAVENOUS | Status: DC | PRN
  Administered 2018-09-03: 50 mg via INTRAVENOUS

## 2018-09-03 MED ORDER — LEVOTHYROXINE SODIUM 75 MCG PO TABS
175.0000 ug | ORAL_TABLET | Freq: Every day | ORAL | Status: DC
  Administered 2018-09-04 – 2018-09-11 (×8): 175 ug via ORAL
  Filled 2018-09-03 (×8): qty 1

## 2018-09-03 MED ORDER — ACETAMINOPHEN 325 MG PO TABS: 325.0000 mg | ORAL_TABLET | ORAL | Status: DC | PRN

## 2018-09-03 SURGICAL SUPPLY — 54 items
BANDAGE ACE 4X5 VEL STRL LF (GAUZE/BANDAGES/DRESSINGS) ×3 IMPLANT
BANDAGE ACE 6X5 VEL STRL LF (GAUZE/BANDAGES/DRESSINGS) ×3 IMPLANT
BIT DRILL 3.8X6 NS (BIT) ×3 IMPLANT
BIT DRILL 4.4 NS (BIT) ×3 IMPLANT
BLADE SURG 10 STRL SS (BLADE) ×3 IMPLANT
BNDG ELASTIC 6X10 VLCR STRL LF (GAUZE/BANDAGES/DRESSINGS) ×3 IMPLANT
BNDG GAUZE ELAST 4 BULKY (GAUZE/BANDAGES/DRESSINGS) ×3 IMPLANT
BRUSH SCRUB SURG 4.25 DISP (MISCELLANEOUS) ×6 IMPLANT
COVER SURGICAL LIGHT HANDLE (MISCELLANEOUS) ×6 IMPLANT
COVER WAND RF STERILE (DRAPES) IMPLANT
DRAPE C-ARM 42X72 X-RAY (DRAPES) ×3 IMPLANT
DRAPE C-ARMOR (DRAPES) ×3 IMPLANT
DRAPE HALF SHEET 40X57 (DRAPES) IMPLANT
DRAPE INCISE IOBAN 66X45 STRL (DRAPES) IMPLANT
DRAPE U-SHAPE 47X51 STRL (DRAPES) ×3 IMPLANT
DRSG ADAPTIC 3X8 NADH LF (GAUZE/BANDAGES/DRESSINGS) ×3 IMPLANT
DRSG MEPITEL 3X4 ME34 (GAUZE/BANDAGES/DRESSINGS) ×3 IMPLANT
DRSG MEPITEL 4X7.2 (GAUZE/BANDAGES/DRESSINGS) ×3 IMPLANT
ELECT REM PT RETURN 9FT ADLT (ELECTROSURGICAL) ×3
ELECTRODE REM PT RTRN 9FT ADLT (ELECTROSURGICAL) ×1 IMPLANT
GAUZE SPONGE 4X4 12PLY STRL (GAUZE/BANDAGES/DRESSINGS) ×3 IMPLANT
GAUZE SPONGE 4X4 12PLY STRL LF (GAUZE/BANDAGES/DRESSINGS) ×3 IMPLANT
GLOVE BIO SURGEON STRL SZ7.5 (GLOVE) ×3 IMPLANT
GLOVE BIO SURGEON STRL SZ8 (GLOVE) ×3 IMPLANT
GLOVE BIO SURGEON STRL SZ8.5 (GLOVE) ×3 IMPLANT
GLOVE BIOGEL PI IND STRL 7.5 (GLOVE) ×1 IMPLANT
GLOVE BIOGEL PI IND STRL 8 (GLOVE) ×1 IMPLANT
GLOVE BIOGEL PI INDICATOR 7.5 (GLOVE) ×2
GLOVE BIOGEL PI INDICATOR 8 (GLOVE) ×2
GOWN STRL REUS W/ TWL LRG LVL3 (GOWN DISPOSABLE) ×2 IMPLANT
GOWN STRL REUS W/ TWL XL LVL3 (GOWN DISPOSABLE) ×1 IMPLANT
GOWN STRL REUS W/TWL LRG LVL3 (GOWN DISPOSABLE) ×4
GOWN STRL REUS W/TWL XL LVL3 (GOWN DISPOSABLE) ×2
GUIDEWIRE BALL NOSE 80CM (WIRE) ×3 IMPLANT
KIT BASIN OR (CUSTOM PROCEDURE TRAY) ×3 IMPLANT
KIT TURNOVER KIT B (KITS) ×3 IMPLANT
NAIL TIBIAL 10MMX36CM (Nail) ×3 IMPLANT
PACK ORTHO EXTREMITY (CUSTOM PROCEDURE TRAY) ×3 IMPLANT
PAD ABD 8X10 STRL (GAUZE/BANDAGES/DRESSINGS) ×3 IMPLANT
PAD ARMBOARD 7.5X6 YLW CONV (MISCELLANEOUS) ×6 IMPLANT
SCREW ACECAP 38MM (Screw) ×6 IMPLANT
SCREW ACECAP 48MM (Screw) ×3 IMPLANT
SCREW PROXIMAL DEPUY (Screw) ×4 IMPLANT
SCREW PRXML FT 50X5.5XLCK NS (Screw) ×1 IMPLANT
SCREW PRXML FT 65X5.5XNS CORT (Screw) ×1 IMPLANT
STAPLER VISISTAT 35W (STAPLE) ×3 IMPLANT
SUT ETHILON 3 0 PS 1 (SUTURE) ×6 IMPLANT
SUT VIC AB 0 CT1 27 (SUTURE)
SUT VIC AB 0 CT1 27XBRD ANBCTR (SUTURE) IMPLANT
SUT VIC AB 2-0 CT1 27 (SUTURE) ×2
SUT VIC AB 2-0 CT1 TAPERPNT 27 (SUTURE) ×1 IMPLANT
TOWEL OR 17X24 6PK STRL BLUE (TOWEL DISPOSABLE) ×3 IMPLANT
TOWEL OR 17X26 10 PK STRL BLUE (TOWEL DISPOSABLE) ×6 IMPLANT
YANKAUER SUCT BULB TIP NO VENT (SUCTIONS) ×3 IMPLANT
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
## 2018-09-03 NOTE — Progress Notes (Signed)
IP rehab admissions - I am following for potential acute inpatient rehab admission.  Awaiting PT/OT evaluations and recommendations.  Patient too sleepy and lethargic to speak with me today.  I will follow up in am.  Call me with questions.  [phone]

## 2018-09-03 NOTE — Progress Notes (Signed)
Orthopedic Tech Progress Note Patient Details:  Laurie Valenzuela [birth year] [id number] Applied OHF Patient ID: [name], female   DOB: 1955/08/17, 63 y.o.   MRN: [id number]   Daphane Shepherd 09/03/2018, 12:01 PM

## 2018-09-03 NOTE — Anesthesia Procedure Notes (Signed)
Procedure Name: Intubation Date/Time: 09/03/2018 8:37 AM Performed by: Waynard Edwards, CRNA Pre-anesthesia Checklist: Patient identified, Emergency Drugs available, Suction available and Patient being monitored Patient Re-evaluated:Patient Re-evaluated prior to induction Oxygen Delivery Method: Circle system utilized Preoxygenation: Pre-oxygenation with 100% oxygen Induction Type: IV induction Ventilation: Mask ventilation without difficulty and Oral airway inserted - appropriate to patient size Laryngoscope Size: Hyacinth Meeker and 2 Grade View: Grade I Tube type: Oral Tube size: 7.0 mm Number of attempts: 1 Airway Equipment and Method: Stylet Placement Confirmation: ETT inserted through vocal cords under direct vision,  positive ETCO2 and breath sounds checked- equal and bilateral Secured at: 21 cm Tube secured with: Tape Dental Injury: Teeth and Oropharynx as per pre-operative assessment

## 2018-09-03 NOTE — Anesthesia Postprocedure Evaluation (Signed)
Anesthesia Post Note  Patient: Laurie Valenzuela  Procedure(s) Performed: INTRAMEDULLARY (IM) NAIL TIBIAL (Right Leg Lower)     Patient location during evaluation: PACU Anesthesia Type: General Level of consciousness: awake and alert Pain management: pain level controlled Vital Signs Assessment: post-procedure vital signs reviewed and stable Respiratory status: spontaneous breathing, nonlabored ventilation, respiratory function stable and patient connected to nasal cannula oxygen Cardiovascular status: blood pressure returned to baseline and stable Postop Assessment: no apparent nausea or vomiting Anesthetic complications: no    Last Vitals:  Vitals:   09/03/18 1055 09/03/18 1102  BP: (!) 135/95 136/82  Pulse: 72 77  Resp: 13 17  Temp:  36.5 C  SpO2: 96% 95%    Last Pain:  Vitals:   09/03/18 1108  PainSc: Asleep    LLE Motor Response: Purposeful movement (09/03/18 1108) LLE Sensation: Full sensation (09/03/18 1108) RLE Motor Response: Purposeful movement (09/03/18 1108) RLE Sensation: Full sensation (09/03/18 1108)      Pamela Intrieri

## 2018-09-03 NOTE — Progress Notes (Addendum)
Orthopedic Tech Progress Note Patient Details:  Laurie Valenzuela February 06, 1956 [id number] OR RN called requesting a PRAFO BOOT Ortho Devices Type of Ortho Device: Prafo boot/shoe Ortho Device/Splint Interventions: Other (comment)   Post Interventions Patient Tolerated: Other (comment) Instructions Provided: Other (comment)   Donald Pore 09/03/2018, 9:43 AM

## 2018-09-03 NOTE — Progress Notes (Signed)
I have seen and examined the patient. I agree with the findings of Mr. Renae Fickle above.  I discussed with the patient the risks and benefits of surgery for right tibia, including the possibility of infection, nerve injury, vessel injury, wound breakdown, arthritis, symptomatic hardware, DVT/ PE, loss of motion, malunion, nonunion, and need for further surgery among others.  She acknowledged these risks and wished to proceed.    Budd Palmer, MD 09/03/2018 8:01 AM

## 2018-09-03 NOTE — Progress Notes (Signed)
  Speech Language Pathology    Patient Details Name: Laurie Valenzuela MRN: [id number] DOB: 1955-08-11 Today's Date: 09/03/2018 Time:  -     Order received. All prior Speech therapy notes reviewed from [facility] and [facility]. Underwent surgery today for tibia and fibula fractures. SLP recommended MBS either before most recent discharge or as outpatient and placed on previous rec's of Dys 3 and nectar consistency. Will plan on MBS tomorrow.                     GO                Royce Macadamia 09/03/2018, 2:07 PM   Breck Coons Lonell Face.Ed Nurse, children's [phone] Office [phone]

## 2018-09-03 NOTE — Social Work (Signed)
Pt from home with assistance from family.   CSW continuing to follow for support with disposition when medically appropriate.  Octavio Graves, MSW, [facility] Clinical Social Work [phone]

## 2018-09-03 NOTE — Progress Notes (Signed)
Pulled morphine to give to patient and when trying to administer medication spilled all over the bed. Pulled a second dose and administered it to patient.

## 2018-09-03 NOTE — Transfer of Care (Signed)
Immediate Anesthesia Transfer of Care Note  Patient: Laurie Valenzuela  Procedure(s) Performed: INTRAMEDULLARY (IM) NAIL TIBIAL (Right Leg Lower)  Patient Location: PACU  Anesthesia Type:General  Level of Consciousness: drowsy  Airway & Oxygen Therapy: Patient Spontanous Breathing and Patient connected to face mask oxygen  Post-op Assessment: Report given to RN and Post -op Vital signs reviewed and stable  Post vital signs: Reviewed and stable  Last Vitals:  Vitals Value Taken Time  BP 137/91 09/03/2018 10:17 AM  Temp 36.5 C 09/03/2018 10:16 AM  Pulse 78 09/03/2018 10:19 AM  Resp 17 09/03/2018 10:19 AM  SpO2 100 % 09/03/2018 10:19 AM  Vitals shown include unvalidated device data.  Last Pain:  Vitals:   09/03/18 1016  PainSc: 0-No pain         Complications: No apparent anesthesia complications

## 2018-09-04 ENCOUNTER — Inpatient Hospital Stay (HOSPITAL_COMMUNITY): Payer: Managed Care, Other (non HMO)

## 2018-09-04 ENCOUNTER — Encounter (HOSPITAL_COMMUNITY): Payer: Self-pay | Admitting: Orthopedic Surgery

## 2018-09-04 LAB — CBC
HCT: 36.4 % (ref 36.0–46.0)
Hemoglobin: 11.8 g/dL — ABNORMAL LOW (ref 12.0–15.0)
MCH: 31.1 pg (ref 26.0–34.0)
MCHC: 32.4 g/dL (ref 30.0–36.0)
MCV: 95.8 fL (ref 80.0–100.0)
NRBC: 0 % (ref 0.0–0.2)
Platelets: 213 10*3/uL (ref 150–400)
RBC: 3.8 MIL/uL — ABNORMAL LOW (ref 3.87–5.11)
RDW: 11.9 % (ref 11.5–15.5)
WBC: 11.9 10*3/uL — AB (ref 4.0–10.5)

## 2018-09-04 LAB — BASIC METABOLIC PANEL
Anion gap: 8 (ref 5–15)
BUN: 15 mg/dL (ref 8–23)
CO2: 27 mmol/L (ref 22–32)
Calcium: 8.8 mg/dL — ABNORMAL LOW (ref 8.9–10.3)
Chloride: 105 mmol/L (ref 98–111)
Creatinine, Ser: 0.69 mg/dL (ref 0.44–1.00)
GFR calc non Af Amer: >60 mL/min (ref >60)
Glucose, Bld: 130 mg/dL — ABNORMAL HIGH (ref 70–99)
Potassium: 3.9 mmol/L (ref 3.5–5.1)
Sodium: 140 mmol/L (ref 135–145)

## 2018-09-04 NOTE — Progress Notes (Signed)
OT Cancellation Note  Patient Details Name: Laurie Valenzuela MRN: [id number] DOB: [birth year]   Cancelled Treatment:    Reason Eval/Treat Not Completed: Other (comment)(Pt's CAM boot for ambulation has not arrived.) OTR to follow up later today.  Revonda Standard Cecil Cranker) Glendell Docker OTR/L Acute Rehabilitation Services Pager: [phone] Office: [phone]   Sandrea Hughs 09/04/2018, 9:56 AM

## 2018-09-04 NOTE — Progress Notes (Signed)
PT Cancellation Note  Patient Details Name: Laurie Valenzuela MRN: [id number] DOB: 12-07-1955   Cancelled Treatment:    Reason Eval/Treat Not Completed: Other (comment)(awaiting arrival of CAM walker boot. ) Will follow.   Ralene Bathe Kistler PT 09/04/2018  Acute Rehabilitation Services Pager [phone] Office [phone]
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
## 2018-09-04 NOTE — Plan of Care (Signed)
  Problem: Clinical Measurements: Goal: Ability to maintain clinical measurements within normal limits will improve Outcome: Progressing Goal: Will remain free from infection Outcome: Progressing Goal: Diagnostic test results will improve Outcome: Progressing Goal: Cardiovascular complication will be avoided Outcome: Progressing   Problem: Elimination: Goal: Will not experience complications related to urinary retention Outcome: Progressing   Problem: Pain Managment: Goal: General experience of comfort will improve Outcome: Progressing

## 2018-09-04 NOTE — Progress Notes (Signed)
Orthopedic Tech Progress Note Patient Details:  Laurie Valenzuela [birth year] [id number]  Ortho Devices Type of Ortho Device: CAM walker Ortho Device/Splint Interventions: Ordered, Application, Adjustment   Post Interventions Patient Tolerated: Well Instructions Provided: Adjustment of device, Care of device   Tashonna Descoteaux J Jeffery Gammell 09/04/2018, 6:01 PM
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
## 2018-09-04 NOTE — Progress Notes (Signed)
Orthopedic Trauma Service Progress Note  Patient ID: Deicy Hollar MRN: [id number] DOB/AGE: Nov 17, 1955 63 y.o.  Subjective:  Doing ok  Pain mild to moderate R leg  No acute issues of note  States she was getting around ok at home after her stroke  Does not think stroke affected her legs all that much  She is agreeable to CIR and seemed to respond favorably to it at the mention of it being a possibility   MBS completed, diet order placed  ROS As above  Objective:   VITALS:   Vitals:   09/03/18 2123 09/04/18 0204 09/04/18 0547 09/04/18 1131  BP: 126/80 (!) 143/88 (!) 156/97 130/75  Pulse: 64 (!) 58 (!) 56 (!) 55  Resp: 19 16 18    Temp: 98.6 F (37 C) 98 F (36.7 C) 97.7 F (36.5 C) 98.4 F (36.9 C)  TempSrc: Oral Oral Oral Oral  SpO2: 99% 100% 99% 98%    Estimated body mass index is 34.19 kg/m as calculated from the following:   Height as of 09/01/18: 5\' 8"  (1.727 m).   Weight as of 09/01/18: 102 kg.   Intake/Output      03/05 0701 - 03/06 0700 03/06 0701 - 03/07 0700   P.O. 800    I.V. 1665.3    IV Piggyback 250    Total Intake 2715.3    Urine 550    Blood 50    Total Output 600    Net +2115.3         Urine Occurrence  1 x     LABS  Results for orders placed or performed during the hospital encounter of 09/02/18 (from the past 24 hour(s))  Basic metabolic panel     Status: Abnormal   Collection Time: 09/04/18  1:59 AM  Result Value Ref Range   Sodium 140 135 - 145 mmol/L   Potassium 3.9 3.5 - 5.1 mmol/L   Chloride 105 98 - 111 mmol/L   CO2 27 22 - 32 mmol/L   Glucose, Bld 130 (H) 70 - 99 mg/dL   BUN 15 8 - 23 mg/dL   Creatinine, Ser 6.54 0.44 - 1.00 mg/dL   Calcium 8.8 (L) 8.9 - 10.3 mg/dL   GFR calc non Af Amer >60 >60 mL/min   GFR calc Af Amer >60 >60 mL/min   Anion gap 8 5 - 15  CBC     Status: Abnormal   Collection Time: 09/04/18  1:59 AM  Result Value  Ref Range   WBC 11.9 (H) 4.0 - 10.5 K/uL   RBC 3.80 (L) 3.87 - 5.11 MIL/uL   Hemoglobin 11.8 (L) 12.0 - 15.0 g/dL   HCT 65.0 35.4 - 65.6 %   MCV 95.8 80.0 - 100.0 fL   MCH 31.1 26.0 - 34.0 pg   MCHC 32.4 30.0 - 36.0 g/dL   RDW 81.2 75.1 - 70.0 %   Platelets 213 150 - 400 K/uL   nRBC 0.0 0.0 - 0.2 %     PHYSICAL EXAM:   Gen: NAD, resting comfortably in bed, communicates clearly  Lungs: breathing unlabored  Cardiac: regular  Ext:       Right Lower Extremity    Dressing c/d/i  Ext warm   + DP Pulse  No DCT   Compartments are soft  No pain with passive stretch   DPN, SPN, TN sensation grossly intact  EHL, FHL, lesser toe motor grossly intact  Weak ankle extension and flexion   No pitting edema   Mild swelling present   Assessment/Plan: 1 Day Post-Op   Principal Problem:   Fracture of tibial shaft, right, closed Active Problems:   Cerebrovascular accident (CVA) (HCC)   Essential (primary) hypertension   Nicotine dependence   Hyperlipidemia   Anti-infectives (From admission, onward)   Start     Dose/Rate Route Frequency Ordered Stop   09/03/18 1200  cefTRIAXone (ROCEPHIN) 1 g in sodium chloride 0.9 % 100 mL IVPB     1 g 200 mL/hr over 30 Minutes Intravenous Every 24 hours 09/03/18 1117     09/03/18 0805  ceFAZolin (ANCEF) 2-4 GM/100ML-% IVPB    Note to Pharmacy:  [name]   : cabinet override      09/03/18 0805 09/03/18 1145    .  POD/HD#: 1  63 y/o female with numerous medical problems including stroke approximately 1 week ago s/p fall with closed R distal 1/3 tibial shaft fracture   -closed R distal 1/3 tibial shaft fracture s/p IMN   PWB R leg, 50 % in CAM   Unrestricted ROM R ankle and knee   PRAFO on when not ambulating. Alternatively can wear CAM during the day and PRAFO at night   PT/OT evals    PT- please teach HEP for R knee ROM- AROM, PROM. Prone exercises as well. No ROM restrictions.  Quad sets, SLR, LAQ, SAQ, heel slides, stretching,  prone flexion and extension    Ankle theraband program, heel cord stretching, toe towel curls, etc. Passive ankle ROM as well     No pillows under bend of knee when at rest, ok to place under heel to help work on extension. Can also use zero knee bone foam if available    Ice and elevate Leg for swelling and pain control   Dressing change Sunday or Monday    CIR eval    - Pain management:  Continue with current regimen   Narcotic use very reasonable   - ABL anemia/Hemodynamics  Stable   Continue to monitor   - Medical issues   Home meds  Stable       U/A suspicious for UTI   Urinalysis done at [facility] suspicious for UTI  Started on Rocephin yesterday   Urine culture pending   Continue rocephin for now    - DVT/PE prophylaxis:  On DAPT (plavix and asa)  Added Lovenox and would recommend 14 day course of lovenox at discharge  - ID:   Rocephin for suspected UTI   Will narrow once culture final    May even complete therapy with rocephin depending on how long culture takes to result   - Metabolic Bone Disease:  Metabolic bone health labs are pending   - Activity:  Up with therapies   PWB R leg (50%) in CAM  - FEN/GI prophylaxis/Foley/Lines:  Reg diet with restrictions as noted by SLP   - Impediments to fracture healing:  Poor bone density   Stroke (increased fall risk?)  Nicotine dependence   Thyroid disease   - Dispo:  Therapy Evals  CIR consult  Continue with current care  Anticipate ready for discharge Monday/tuesday     Mearl Latin, PA-C [phone] (C) 09/04/2018, 3:18 PM  [facility] [address] [place] [address] [phone] (F)
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
## 2018-09-04 NOTE — Progress Notes (Signed)
Modified Barium Swallow Progress Note  Patient Details  Name: Laurie Valenzuela MRN: [id number] Date of Birth: 03-05-1956  Today's Date: 09/04/2018  Modified Barium Swallow completed.  Full report located under Chart Review in the Imaging Section.  Brief recommendations include the following:  Clinical Impression  Pt presents with mild-moderate pharyngeal phase dysphagia characterized by reduced lingual retraction, a pharyngeal delay, and reduced laryngeal excursion. She demontrated vallecular and pyriform sinus residue which were cleared with dry secondary swallows, penetration (PAS 2, 3) of thin liquids via cup and aspiration (PAS 5, 7) of thin liquids via straw. Penetration and aspiration were eliminated with use of individual sips and a chin tuck posture. It is recommended that a regular texture diet with thin liquids be initiated at this time with observance of swallwing precautions to reduce apiration risk.    Swallow Evaluation Recommendations       SLP Diet Recommendations: Thin liquid;Regular solids   Liquid Administration via: Cup;No straw   Medication Administration: Whole meds with puree   Supervision: Intermittent supervision to cue for compensatory strategies   Compensations: Slow rate;Small sips/bites;Chin tuck;Follow solids with liquid   Postural Changes: Remain semi-upright after after feeds/meals (Comment);Seated upright at 90 degrees   Oral Care Recommendations: Oral care BID       Tevon Berhane I. Vear Clock, MS, CCC-SLP Acute Rehabilitation Services Office number [phone] Pager [phone]  Scheryl Marten 09/04/2018,1:50 PM
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
## 2018-09-05 LAB — COMPREHENSIVE METABOLIC PANEL
ALT: 22 U/L (ref 0–44)
AST: 17 U/L (ref 15–41)
Albumin: 3.1 g/dL — ABNORMAL LOW (ref 3.5–5.0)
Alkaline Phosphatase: 67 U/L (ref 38–126)
Anion gap: 10 (ref 5–15)
BUN: 16 mg/dL (ref 8–23)
CALCIUM: 8.8 mg/dL — AB (ref 8.9–10.3)
CO2: 23 mmol/L (ref 22–32)
Chloride: 104 mmol/L (ref 98–111)
Creatinine, Ser: 0.73 mg/dL (ref 0.44–1.00)
GFR calc Af Amer: >60 mL/min (ref >60)
Glucose, Bld: 105 mg/dL — ABNORMAL HIGH (ref 70–99)
Potassium: 4.1 mmol/L (ref 3.5–5.1)
Sodium: 137 mmol/L (ref 135–145)
TOTAL PROTEIN: 6.4 g/dL — AB (ref 6.5–8.1)
Total Bilirubin: 0.7 mg/dL (ref 0.3–1.2)

## 2018-09-05 LAB — CBC
HCT: 34.1 % — ABNORMAL LOW (ref 36.0–46.0)
Hemoglobin: 11.2 g/dL — ABNORMAL LOW (ref 12.0–15.0)
MCH: 31.3 pg (ref 26.0–34.0)
MCHC: 32.8 g/dL (ref 30.0–36.0)
MCV: 95.3 fL (ref 80.0–100.0)
Platelets: 230 10*3/uL (ref 150–400)
RBC: 3.58 MIL/uL — ABNORMAL LOW (ref 3.87–5.11)
RDW: 11.8 % (ref 11.5–15.5)
WBC: 10.2 10*3/uL (ref 4.0–10.5)
nRBC: 0 % (ref 0.0–0.2)

## 2018-09-05 LAB — URINE CULTURE: Culture: <10000 — AB

## 2018-09-05 LAB — PREALBUMIN: Prealbumin: 16.6 mg/dL — ABNORMAL LOW (ref 18–38)
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
## 2018-09-05 NOTE — Progress Notes (Signed)
Nurse and NT Alesha go into room in response to bed alarm sounding and find food tray plate and food in the floor.  Asked patient what happened and she responded "I wanted it out of my way so I threw it in the floor".  Nurse explained if she used call bell staff is happy to help her move things out of the way.  Patient refuses bath after multiple attempts to assist her with one today.  Will continue to monitor.

## 2018-09-05 NOTE — Progress Notes (Signed)
SPORTS MEDICINE AND JOINT REPLACEMENT  Georgena Spurling, MD    Laurier Nancy, PA-C [address] [place], [place], [place]  [address]                             [phone]   PROGRESS NOTE  Subjective:  negative for Chest Pain  negative for Shortness of Breath  negative for Nausea/Vomiting   negative for Calf Pain  negative for Bowel Movement   Tolerating Diet: yes         Patient reports pain as 4 on 0-10 scale.    Objective: Vital signs in last 24 hours:    Patient Vitals for the past 24 hrs:  BP Temp Temp src Pulse Resp SpO2  09/05/18 0638 (!) 163/97 (!) 97.5 F (36.4 C) Oral 61 18 97 %  09/04/18 2229 (!) 151/92 98.6 F (37 C) Oral 60 18 96 %  09/04/18 1749 118/65 97.8 F (36.6 C) Oral (!) 57 - 98 %  09/04/18 1131 130/75 98.4 F (36.9 C) Oral (!) 55 - 98 %    @flow {1959:LAST@   Intake/Output from previous day:   03/06 0701 - 03/07 0700 In: 1547.9 [P.O.:240; I.V.:1207.9] Out: 450 [Urine:450]   Intake/Output this shift:   03/07 0701 - 03/07 1900 In: 67.3 [I.V.:67.3] Out: 500 [Urine:500]   Intake/Output      03/06 0701 - 03/07 0700 03/07 0701 - 03/08 0700   P.O. 240    I.V. 1207.9 67.3   IV Piggyback 100    Total Intake 1547.9 67.3   Urine 450 500   Blood     Total Output 450 500   Net +1097.9 -432.7        Urine Occurrence 2 x       LABORATORY DATA: Recent Labs    08/30/18 0420 09/01/18 1630 09/02/18 0421 09/03/18 1127 09/04/18 0159 09/05/18 0300  WBC 8.1 10.7* 10.0 14.9* 11.9* 10.2  HGB 13.3 12.9 12.5 12.8 11.8* 11.2*  HCT 39.1 39.1 38.0 40.1 36.4 34.1*  PLT 239 260 239 232 213 230   Recent Labs    08/30/18 0420 09/01/18 1630 09/02/18 0421 09/03/18 1127 09/04/18 0159 09/05/18 0300  NA 140 140 142  --  140 137  K 3.7 4.0 3.4*  --  3.9 4.1  CL 106 105 104  --  105 104  CO2 27 27 28   --  27 23  BUN 12 18 19   --  15 16  CREATININE 0.80 0.69 0.92 0.83 0.69 0.73  GLUCOSE 104* 109* 133*  --  130* 105*  CALCIUM 9.0 9.1 9.2  --  8.8* 8.8*    Lab Results  Component Value Date   INR 1.0 09/01/2018   INR 0.9 08/28/2018    Examination:  General appearance: alert, cooperative and no distress Extremities: extremities normal, atraumatic, no cyanosis or edema  Wound Exam: clean, dry, intact   Drainage:  None: wound tissue dry  Motor Exam: Quadriceps and Hamstrings Intact  Sensory Exam: Superficial Peroneal, Deep Peroneal and Tibial normal   Assessment:    2 Days Post-Op  Procedure(s) (LRB): INTRAMEDULLARY (IM) NAIL TIBIAL (Right)  ADDITIONAL DIAGNOSIS:  Principal Problem:   Fracture of tibial shaft, right, closed Active Problems:   Cerebrovascular accident (CVA) (HCC)   Essential (primary) hypertension   Nicotine dependence   Hyperlipidemia     Plan: Physical Therapy as ordered Partial Weight Bearing @ 50% (PWB)  DVT Prophylaxis:  Lovenox  DISCHARGE PLAN: CIR  patient resting comfortably. Pain controlled. No wound issues. Awaiting decision on CIR. Will continue to follow through weekend  Guy Sandifer 09/05/2018, 8:26 AM

## 2018-09-06 LAB — PTH, INTACT AND CALCIUM
Calcium, Total (PTH): 8.8 mg/dL (ref 8.7–10.3)
PTH: 29 pg/mL (ref 15–65)

## 2018-09-06 LAB — CALCIUM, IONIZED: Calcium, Ionized, Serum: 5 mg/dL (ref 4.5–5.6)

## 2018-09-06 NOTE — Progress Notes (Signed)
CSW met with patient to discuss care planning. Patient would prefer CIR admission, awaiting decision from admissions if they will be able to admit her. CSW asked about SNF placement as a back-up; patient not interested. Patient would prefer to go home if denied CIR, and will have 24 hour assistance at discharge. Patient said that between her daughter, her boyfriend, and a friend of hers, they will take shifts in providing 24 hour care for when she returns home until she is able to do things on her own again. Patient would be interested in home health, if needed; would like more information on that if CIR declines, did not want to discuss today.  Laveda Abbe, [place] Clinical Social Worker [phone]

## 2018-09-06 NOTE — Plan of Care (Signed)

## 2018-09-06 NOTE — Plan of Care (Signed)

## 2018-09-06 NOTE — Progress Notes (Signed)
SPORTS MEDICINE AND JOINT REPLACEMENT  Georgena Spurling, MD    Laurier Nancy, PA-C [address] [place], [place], [place]  [address]                             [phone]   PROGRESS NOTE  Subjective:  negative for Chest Pain  negative for Shortness of Breath  negative for Nausea/Vomiting   negative for Calf Pain  negative for Bowel Movement   Tolerating Diet: yes         Patient reports pain as 3 on 0-10 scale.    Objective: Vital signs in last 24 hours:    Patient Vitals for the past 24 hrs:  BP Temp Temp src Pulse Resp SpO2  09/06/18 0531 (!) 143/95 98.5 F (36.9 C) Oral 65 20 95 %  09/05/18 1957 131/81 98.1 F (36.7 C) Oral 64 20 93 %    @flow {1959:LAST@   Intake/Output from previous day:   03/07 0701 - 03/08 0700 In: 200.6 [I.V.:200.6] Out: 1700 [Urine:1700]   Intake/Output this shift:   No intake/output data recorded.   Intake/Output      03/07 0701 - 03/08 0700 03/08 0701 - 03/09 0700   P.O.     I.V. 200.6    IV Piggyback 0    Total Intake 200.6    Urine 1700    Total Output 1700    Net -1499.4            LABORATORY DATA: Recent Labs    09/01/18 1630 09/02/18 0421 09/03/18 1127 09/04/18 0159 09/05/18 0300  WBC 10.7* 10.0 14.9* 11.9* 10.2  HGB 12.9 12.5 12.8 11.8* 11.2*  HCT 39.1 38.0 40.1 36.4 34.1*  PLT 260 239 232 213 230   Recent Labs    09/01/18 1630 09/02/18 0421 09/03/18 1127 09/04/18 0159 09/05/18 0300  NA 140 142  --  140 137  K 4.0 3.4*  --  3.9 4.1  CL 105 104  --  105 104  CO2 27 28  --  27 23  BUN 18 19  --  15 16  CREATININE 0.69 0.92 0.83 0.69 0.73  GLUCOSE 109* 133*  --  130* 105*  CALCIUM 9.1 9.2  --  8.8* 8.8*   Lab Results  Component Value Date   INR 1.0 09/01/2018   INR 0.9 08/28/2018    Examination:  General appearance: alert, cooperative and no distress Extremities: extremities normal, atraumatic, no cyanosis or edema  Wound Exam: clean, dry, intact   Drainage:  None: wound tissue dry  Motor Exam:  Quadriceps and Hamstrings Intact  Sensory Exam: Superficial Peroneal, Deep Peroneal and Tibial normal   Assessment:    3 Days Post-Op  Procedure(s) (LRB): INTRAMEDULLARY (IM) NAIL TIBIAL (Right)  ADDITIONAL DIAGNOSIS:  Principal Problem:   Fracture of tibial shaft, right, closed Active Problems:   Cerebrovascular accident (CVA) (HCC)   Essential (primary) hypertension   Nicotine dependence   Hyperlipidemia     Plan: Physical Therapy as ordered Partial Weight Bearing @ 50% (PWB)  DVT Prophylaxis:  Lovenox  Patient is doing much better today, she is sitting up at bedside eating breakfast. Pain is much more under control compared to yesterday. Discharge tbd, hopeful for CIR placement.   Guy Sandifer 09/06/2018, 9:41 AM

## 2018-09-07 ENCOUNTER — Telehealth: Payer: Self-pay

## 2018-09-07 ENCOUNTER — Ambulatory Visit: Payer: Managed Care, Other (non HMO) | Admitting: Occupational Therapy

## 2018-09-07 LAB — VITAMIN D 25 HYDROXY (VIT D DEFICIENCY, FRACTURES): Vit D, 25-Hydroxy: 30 ng/mL (ref 30.0–100.0)

## 2018-09-07 LAB — CALCITRIOL (1,25 DI-OH VIT D): VIT D 1 25 DIHYDROXY: 28.9 pg/mL (ref 19.9–79.3)

## 2018-09-07 MED ORDER — VITAMIN C 500 MG PO TABS
500.0000 mg | ORAL_TABLET | Freq: Every day | ORAL | Status: DC
  Administered 2018-09-07 – 2018-09-11 (×5): 500 mg via ORAL
  Filled 2018-09-07 (×5): qty 1

## 2018-09-07 MED ORDER — VITAMIN D 25 MCG (1000 UNIT) PO TABS
2000.0000 [IU] | ORAL_TABLET | Freq: Two times a day (BID) | ORAL | Status: DC
  Administered 2018-09-07 – 2018-09-11 (×9): 2000 [IU] via ORAL
  Filled 2018-09-07 (×9): qty 2

## 2018-09-07 NOTE — Progress Notes (Signed)
Orthopedic Trauma Service Progress Note  Patient ID: Laurie Valenzuela MRN: [id number] DOB/AGE: 04-May-1956 63 y.o.  Subjective:  No complaints Doing well Would like to go to CIR    ROS As above   Objective:   VITALS:   Vitals:   09/06/18 1344 09/06/18 2114 09/07/18 0433 09/07/18 0949  BP: 128/84 (!) 150/89 (!) 142/85   Pulse: 67 71 61   Resp: 18 20 18    Temp: 98.2 F (36.8 C) 99.2 F (37.3 C) 98.7 F (37.1 C)   TempSrc: Oral Oral Oral   SpO2: 96% 97% 93%   Height:    5\' 8"  (1.727 m)    Estimated body mass index is 34.19 kg/m as calculated from the following:   Height as of this encounter: 5\' 8"  (1.727 m).   Weight as of 09/01/18: 102 kg.   Intake/Output      03/08 0701 - 03/09 0700 03/09 0701 - 03/10 0700   I.V.     IV Piggyback     Total Intake     Urine 1600 250   Total Output 1600 250   Net -1600 -250          LABS  No results found for this or any previous visit (from the past 24 hour(s)).   PHYSICAL EXAM:   Gen: resting comfortably in bed, NAD, appears well  Lungs: unlabored Cardiac: regular  Ext:      Right Lower Extremity     Dressing c/d/i   Dressings removed   All wounds well healed   No drainage    No signs of infection               Ext warm              + DP Pulse             No DCT              Compartments are soft              No pain with passive stretch              DPN, SPN, TN sensation grossly intact             EHL, FHL, lesser toe motor grossly intact             Weak ankle extension and flexion              No pitting edema              swelling improved    Assessment/Plan: 4 Days Post-Op   Principal Problem:   Fracture of tibial shaft, right, closed Active Problems:   Cerebrovascular accident (CVA) (HCC)   Essential (primary) hypertension   Nicotine dependence   Hyperlipidemia   Anti-infectives (From admission, onward)     Start     Dose/Rate Route Frequency Ordered Stop   09/03/18 1200  cefTRIAXone (ROCEPHIN) 1 g in sodium chloride 0.9 % 100 mL IVPB     1 g 200 mL/hr over 30 Minutes Intravenous Every 24 hours 09/03/18 1117     09/03/18 0805  ceFAZolin (ANCEF) 2-4 GM/100ML-% IVPB    Note to Pharmacy:  [name]   : cabinet override      09/03/18 0805  09/03/18 1145    .  POD/HD#: 72  63 y/o female with numerous medical problems including stroke approximately 1 week ago s/p fall with closed R distal 1/3 tibial shaft fracture    -closed R distal 1/3 tibial shaft fracture s/p IMN              PWB R leg, 50 % in CAM              Unrestricted ROM R ankle and knee                         PRAFO on when not ambulating. Alternatively can wear CAM during the day and PRAFO at night               PT/OT evals                             PT- please teach HEP for R knee ROM- AROM, PROM. Prone exercises as well. No ROM restrictions.  Quad sets, SLR, LAQ, SAQ, heel slides, stretching, prone flexion and extension                             Ankle theraband program, heel cord stretching, toe towel curls, etc. Passive ankle ROM as well                              No pillows under bend of knee when at rest, ok to place under heel to help work on extension. Can also use zero knee bone foam if available                 Ice and elevate Leg for swelling and pain control              Dressing changed today    Ok to leave uncovered   Ok to wash with soap and water only at this time                CIR eval               - Pain management:             Continue with current regimen              Narcotic use very reasonable    - ABL anemia/Hemodynamics             Stable              Continue to monitor    - Medical issues              Home meds             Stable        U/A suspicious for UTI   Urine culture did not grow out significant numbers  Dc rocephin               - DVT/PE prophylaxis:             On  DAPT (plavix and asa)             + Lovenox x 14 days    - ID:   dc'd abx   - Metabolic Bone Disease:             vitamin d  low end of normal    Supplement   Recommend DEXA as an outpt  Pt with numerous RF for osteoporosis    - Activity:             Up with therapies              PWB R leg (50%) in CAM   - FEN/GI prophylaxis/Foley/Lines:             Reg diet with restrictions as noted by SLP    - Impediments to fracture healing:             Poor bone density              Stroke (increased fall risk?)             Nicotine dependence              Thyroid disease    - Dispo:             Therapy              CIR consult             Continue with current care                 Mearl Latin, PA-C [phone] (C) 09/07/2018, 12:33 PM  [facility] [address] [place] [address] [phone] (F)
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
## 2018-09-07 NOTE — Telephone Encounter (Signed)
Called patient's daughter Isabelle Course) and left message re: pt's recent hospitalization and whether she planned on returning to outpatient or to cancel visits. Left phone number to call back
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
## 2018-09-07 NOTE — Progress Notes (Signed)
IP rehab admissions - I have opened the case with [employer] and have faxed clinicals requesting acute inpatient rehab admission.  I will follow up once I hear back from insurance case manager.  Call me for questions.  [phone]

## 2018-09-08 NOTE — Progress Notes (Signed)
IP rehab admissions - I have received a denial from insurance carrier for acute inpatient rehab admission.  Options will now be SNF or home with [facility] therapies per patient preference.  Call me for questions.  [phone]

## 2018-09-08 NOTE — Care Management Note (Signed)
Case Management Note  Patient Details  Name: Laurie Valenzuela MRN: [id number] Date of Birth: April 27, 1956  Subjective/Objective:                    Action/Plan:  Discussed discharge planning with patient. Patient undecided between SNF vs home health , she wants to discuss with her MD and family. Will follow up in morning. Expected Discharge Date:                  Expected Discharge Plan:     In-House Referral:     Discharge planning Services  CM Consult  Post Acute Care Choice:    Choice offered to:  Patient  DME Arranged:    DME Agency:     HH Arranged:    HH Agency:     Status of Service:  In process, will continue to follow  If discussed at Long Length of Stay Meetings, dates discussed:    Additional Comments:  Kingsley Plan, RN 09/08/2018, 3:08 PM

## 2018-09-08 NOTE — Progress Notes (Signed)
Orthopedic Trauma Service Progress Note  Patient ID: Laurie Valenzuela MRN: [id number] DOB/AGE: 64-Jun-1957 64 y.o.  Subjective:  No acute issues Denied by insurance for CIR   Discussing with family SNF vs Home with HH    ROS As above   Objective:   VITALS:   Vitals:   09/07/18 0949 09/07/18 1458 09/07/18 2049 09/08/18 0624  BP:  111/76 108/73 114/73  Pulse:  61 (!) 58 (!) 55  Resp:  18 18 18   Temp:  98 F (36.7 C) 98.3 F (36.8 C) 97.7 F (36.5 C)  TempSrc:  Oral Oral Oral  SpO2:  96% 94% 95%  Height: 5\' 8"  (1.727 m)       Estimated body mass index is 34.19 kg/m as calculated from the following:   Height as of this encounter: 5\' 8"  (1.727 m).   Weight as of 09/01/18: 102 kg.   Intake/Output      03/09 0701 - 03/10 0700 03/10 0701 - 03/11 0700   P.O. 780 360   Total Intake 780 360   Urine 950 300   Total Output 950 300   Net -170 +60          LABS  No results found for this or any previous visit (from the past 24 hour(s)).   PHYSICAL EXAM:   Gen: sleeping comfortably but easily aroused  Lungs: unlabored Cardiac: regular  Ext:      Right Lower Extremity                Dressing c/d/i               Ext warm              + DP Pulse             No DCT              exam unchanged otherwise   Assessment/Plan: 5 Days Post-Op   Principal Problem:   Fracture of tibial shaft, right, closed Active Problems:   Cerebrovascular accident (CVA) (HCC)   Essential (primary) hypertension   Nicotine dependence   Hyperlipidemia   Anti-infectives (From admission, onward)   Start     Dose/Rate Route Frequency Ordered Stop   09/03/18 1200  cefTRIAXone (ROCEPHIN) 1 g in sodium chloride 0.9 % 100 mL IVPB  Status:  Discontinued     1 g 200 mL/hr over 30 Minutes Intravenous Every 24 hours 09/03/18 1117 09/07/18 1238   09/03/18 0805  ceFAZolin (ANCEF) 2-4 GM/100ML-% IVPB    Note  to Pharmacy:  [name]   : cabinet override      09/03/18 0805 09/03/18 1145    .  POD/HD#: 32  63 y/o female with numerous medical problems including stroke approximately 1 week ago s/p fall with closed R distal 1/3 tibial shaft fracture    -closed R distal 1/3 tibial shaft fracture s/p IMN              PWB R leg, 50 % in CAM              Unrestricted ROM R ankle and knee                         PRAFO on when not  ambulating. Alternatively can wear CAM during the day and PRAFO at night               PT/OT evals                              PT- please teach HEP for R knee ROM- AROM, PROM. Prone exercises as well. No ROM restrictions.  Quad sets, SLR, LAQ, SAQ, heel slides, stretching, prone flexion and extension                             Ankle theraband program, heel cord stretching, toe towel curls, etc. Passive ankle ROM as well                              No pillows under bend of knee when at rest, ok to place under heel to help work on extension. Can also use zero knee bone foam if available                 Ice and elevate Leg for swelling and pain control              Dressing changes as needed                          Ok to leave uncovered                         Ok to wash with soap and water only at this time    - Pain management:             Continue with current regimen              Narcotic use very reasonable    - ABL anemia/Hemodynamics             Stable              Continue to monitor    - Medical issues              Home meds             Stable    - DVT/PE prophylaxis:             On DAPT (plavix and asa)             Lovenox x 9 more days      - Metabolic Bone Disease:             vitamin d low end of normal                          Supplement              Recommend DEXA as an outpt             Pt with numerous RF for osteoporosis    - Activity:             Up with therapies              PWB R leg (50%) in CAM   - FEN/GI  prophylaxis/Foley/Lines:             Reg diet with restrictions as noted by SLP    - Impediments  to fracture healing:             Poor bone density              Stroke (increased fall risk?)             Nicotine dependence              Thyroid disease    - Dispo:             Therapy              SNF vs HH, pt and family to decide tonight as CIR denied by insurance  Plan for dc tomorrow    Mearl Latin, PA-C [phone] (C) 09/08/2018, 4:02 PM  [facility] [address] [place] [address] [phone] [name] (F)

## 2018-09-09 MED ORDER — DOCUSATE SODIUM 100 MG PO CAPS: 100.0000 mg | ORAL_CAPSULE | Freq: Two times a day (BID) | ORAL | 0 refills | Status: DC

## 2018-09-09 MED ORDER — VITAMIN D3 125 MCG (5000 UT) PO TABS: 1.0000 | ORAL_TABLET | Freq: Every day | ORAL | 2 refills | Status: DC

## 2018-09-09 MED ORDER — ACETAMINOPHEN 325 MG PO TABS: 325.0000 mg | ORAL_TABLET | Freq: Four times a day (QID) | ORAL | 0 refills | Status: DC | PRN

## 2018-09-09 MED ORDER — ENOXAPARIN SODIUM 40 MG/0.4ML ~~LOC~~ SOLN: 40.0000 mg | SUBCUTANEOUS | 0 refills | Status: DC

## 2018-09-09 MED ORDER — ASCORBIC ACID 500 MG PO TABS: 500.0000 mg | ORAL_TABLET | Freq: Every day | ORAL | 0 refills | Status: DC

## 2018-09-09 MED ORDER — ASPIRIN 81 MG PO CHEW: 81.0000 mg | CHEWABLE_TABLET | Freq: Every day | ORAL | 0 refills | Status: AC

## 2018-09-09 MED ORDER — HYDROCODONE-ACETAMINOPHEN 5-325 MG PO TABS: 1.0000 | ORAL_TABLET | Freq: Four times a day (QID) | ORAL | 0 refills | Status: DC | PRN

## 2018-09-09 MED ORDER — CLOPIDOGREL BISULFATE 75 MG PO TABS: 75.0000 mg | ORAL_TABLET | Freq: Every day | ORAL | 1 refills | Status: DC

## 2018-09-09 NOTE — Care Management Note (Signed)
Case Management Note  Patient Details  Name: Laurie Valenzuela MRN: [id number] Date of Birth: 05/17/1956  Subjective/Objective:                    Action/Plan: Provided Medicare.gov list to patient.   Patient called daughter [name] [phone] and placed on speak phone. Together they decided on [facility]. SW messaged Admissions at [facility]. NCM left voicemail for [facility] [phone] ext [phone].   Montez Morita PA and bedside nurse aware.  Patient and daughter understand Sonny Dandy will submit for insurance auth, discharge once received.   Expected Discharge Date:  09/09/18               Expected Discharge Plan:  Skilled Nursing Facility  In-House Referral:     Discharge planning Services  CM Consult  Post Acute Care Choice:  Skilled Nursing Facility Choice offered to:  Patient  DME Arranged:  N/A DME Agency:  NA  HH Arranged:  NA HH Agency:  NA  Status of Service:  In process, will continue to follow  If discussed at Long Length of Stay Meetings, dates discussed:    Additional Comments:  Kingsley Plan, RN 09/09/2018, 1:08 PM

## 2018-09-09 NOTE — Care Management Note (Addendum)
Case Management Note  Patient Details  Name: Laurie Valenzuela MRN: [id number] Date of Birth: Nov 28, 1955  Subjective/Objective:                    Action/Plan:  Earlier today spoke to patient at bedside. Patient had decided to go home with home health , she did not want SNF.   Home address is [address], [place] , [place] [address], phone [phone]. Patient lives alone but is going to have her friend Greggory Stallion stay with her.   Patient requested walker and 3 in 1. Greggory Stallion would transport her home, she did not want ambulance.   NCM called [phone] spoke with Andrey Campanile provided all information for home health with start of care date 09/10/18 . Intake number [id number] fax [phone].   Patient's daughter Georgia Dom [phone] arrived with concerns for her mother going home with home health. After patient and Isabelle Course discussed same, patient agreed to go to SNF. Bedside nurse informed Renae Fickle PA   Will begin SNF work up . [facility] site temporary down.  Expected Discharge Date:  09/09/18               Expected Discharge Plan:     In-House Referral:     Discharge planning Services  CM Consult  Post Acute Care Choice:    Choice offered to:  Patient  DME Arranged:    DME Agency:     HH Arranged:    HH Agency:     Status of Service:  In process, will continue to follow  If discussed at Long Length of Stay Meetings, dates discussed:    Additional Comments:  Kingsley Plan, RN 09/09/2018, 10:35 AM

## 2018-09-09 NOTE — Discharge Instructions (Signed)
Orthopaedic Trauma Service Discharge Instructions   General Discharge Instructions  WEIGHT BEARING STATUS: Partial weightbearing right leg, 50%  RANGE OF MOTION/ACTIVITY: Unrestricted range of motion right knee and ankle.  Activity as tolerated.  Up with assistance with walker to assist with ambulation.  Must wear cam boot when ambulating, wear PRAFO boot (gray boot) when at rest or at night   Wound Care: Daily wound care as needed starting immediately.  Okay to leave wounds open to the air as they are dry.  Can use TED hose to help with swelling control.  Okay to shower and clean wounds with soap and water only.  Do not use any lotion, ointments or solutions such as hydrogen peroxide or Betadine to the wounds.  Discharge Wound Care Instructions  Do NOT apply any ointments, solutions or lotions to pin sites or surgical wounds.  These prevent needed drainage and even though solutions like hydrogen peroxide kill bacteria, they also damage cells lining the pin sites that help fight infection.  Applying lotions or ointments can keep the wounds moist and can cause them to breakdown and open up as well. This can increase the risk for infection. When in doubt call the office.  Surgical incisions should be dressed daily.  If any drainage is noted, use one layer of adaptic, then gauze, Kerlix, and an ace wrap.  Once the incision is completely dry and without drainage, it may be left open to air out.  Showering may begin 36-48 hours later.  Cleaning gently with soap and water.  Traumatic wounds should be dressed daily as well.    One layer of adaptic, gauze, Kerlix, then ace wrap.  The adaptic can be discontinued once the draining has ceased    If you have a wet to dry dressing: wet the gauze with saline the squeeze as much saline out so the gauze is moist (not soaking wet), place moistened gauze over wound, then place a dry gauze over the moist one, followed by Kerlix wrap, then ace  wrap.   DVT/PE prophylaxis: Continue on Lovenox 1 injection daily for the next days.  Also continue to take your Plavix and aspirin which were prescribed to you after your first admission after your stroke  Diet: See the recommendations below were provided after your swallowing evaluations including modified barium swallow study     Diet recommendations: Regular;Thin liquid Liquids provided via: Cup;No straw Medication Administration: Whole meds with puree Supervision: Patient able to self feed;Intermittent supervision to cue for compensatory strategies Compensations: Slow rate;Small sips/bites;Chin tuck Postural Changes and/or Swallow Maneuvers: Seated upright 90 degrees;Chin tuck    PAIN MEDICATION USE AND EXPECTATIONS  You have likely been given narcotic medications to help control your pain.  After a traumatic event that results in an fracture (broken bone) with or without surgery, it is ok to use narcotic pain medications to help control one's pain.  We understand that everyone responds to pain differently and each individual patient will be evaluated on a regular basis for the continued need for narcotic medications. Ideally, narcotic medication use should last no more than 6-8 weeks (coinciding with fracture healing).   As a patient it is your responsibility as well to monitor narcotic medication use and report the amount and frequency you use these medications when you come to your office visit.   We would also advise that if you are using narcotic medications, you should take a dose prior to therapy to maximize you participation.  IF YOU  ARE ON NARCOTIC MEDICATIONS IT IS NOT PERMISSIBLE TO OPERATE A MOTOR VEHICLE (MOTORCYCLE/CAR/TRUCK/MOPED) OR HEAVY MACHINERY DO NOT MIX NARCOTICS WITH OTHER CNS (CENTRAL NERVOUS SYSTEM) DEPRESSANTS SUCH AS ALCOHOL   STOP SMOKING OR USING NICOTINE PRODUCTS!!!!  As discussed nicotine severely impairs your body's ability to heal surgical and traumatic  wounds but also impairs bone healing.  Wounds and bone heal by forming microscopic blood vessels (angiogenesis) and nicotine is a vasoconstrictor (essentially, shrinks blood vessels).  Therefore, if vasoconstriction occurs to these microscopic blood vessels they essentially disappear and are unable to deliver necessary nutrients to the healing tissue.  This is one modifiable factor that you can do to dramatically increase your chances of healing your injury.    (This means no smoking, no nicotine gum, patches, etc)  DO NOT USE NONSTEROIDAL ANTI-INFLAMMATORY DRUGS (NSAID'S)  Using products such as Advil (ibuprofen), Aleve (naproxen), Motrin (ibuprofen) for additional pain control during fracture healing can delay and/or prevent the healing response.  If you would like to take over the counter (OTC) medication, Tylenol (acetaminophen) is ok.  However, some narcotic medications that are given for pain control contain acetaminophen as well. Therefore, you should not exceed more than 4000 mg of tylenol in a day if you do not have liver disease.  Also note that there are may OTC medicines, such as cold medicines and allergy medicines that my contain tylenol as well.  If you have any questions about medications and/or interactions please ask your doctor/PA or your pharmacist.      ICE AND ELEVATE INJURED/OPERATIVE EXTREMITY  Using ice and elevating the injured extremity above your heart can help with swelling and pain control.  Icing in a pulsatile fashion, such as 20 minutes on and 20 minutes off, can be followed.    Do not place ice directly on skin. Make sure there is a barrier between to skin and the ice pack.    Using frozen items such as frozen peas works well as the conform nicely to the are that needs to be iced.  USE AN ACE WRAP OR TED HOSE FOR SWELLING CONTROL  In addition to icing and elevation, Ace wraps or TED hose are used to help limit and resolve swelling.  It is recommended to use Ace wraps or  TED hose until you are informed to stop.    When using Ace Wraps start the wrapping distally (farthest away from the body) and wrap proximally (closer to the body)   Example: If you had surgery on your leg or thing and you do not have a splint on, start the ace wrap at the toes and work your way up to the thigh        If you had surgery on your upper extremity and do not have a splint on, start the ace wrap at your fingers and work your way up to the upper arm   IF YOU ARE IN A CAM BOOT (BLACK BOOT)  You may remove boot periodically. Perform daily dressing changes as noted below.  Wash the liner of the boot regularly and wear a sock when wearing the boot. It is recommended that you sleep in the boot until told otherwise  CALL THE OFFICE WITH ANY QUESTIONS OR CONCERNS: [phone]

## 2018-09-09 NOTE — [place] FL2 (Signed)
[place] MEDICAID FL2 LEVEL OF CARE SCREENING TOOL     IDENTIFICATION  Patient Name: Laurie Valenzuela Birthdate: 1955/10/05 Sex: female Admission Date (Current Location): 09/02/2018  [facility] and [place] Number:  Chiropodist and Address:  [facility], [address], [place], [place] [address]      Provider Number: [id number]  Attending Physician Name and Address:  Myrene Galas, MD  Relative Name and Phone Number:  daughter [name] [phone]    Current Level of Care: Hospital Recommended Level of Care: Skilled Nursing Facility Prior Approval Number:    Date Approved/Denied: 09/09/18 PASRR Number: [id number]  Discharge Plan: SNF    Current Diagnoses: Patient Active Problem List   Diagnosis Date Noted  . Right tibial fracture 09/03/2018  . Fracture of tibial shaft, right, closed 09/03/2018  . Tibia/fibula fracture 09/01/2018  . CVA (cerebral vascular accident) (HCC) 08/29/2018  . Hyperlipidemia 08/29/2018  . Stroke (HCC) 08/28/2018  . Cerebrovascular accident (CVA) (HCC) 04/26/2015  . Essential (primary) hypertension 04/26/2015  . 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 Nicotine dependence 04/26/2015    Orientation RESPIRATION BLADDER Height & Weight     Self, Time, Situation, Place  Normal Continent Weight:   Height:  5\' 8"  (172.7 cm)  BEHAVIORAL SYMPTOMS/MOOD NEUROLOGICAL BOWEL NUTRITION STATUS      Continent Diet(Regular thin liquids )  AMBULATORY STATUS COMMUNICATION OF NEEDS Skin   Limited Assist(partial weight bearing to right leg; CAM boot to rt when ambulating : PRAFO boot when non ambulating ) Verbally Other (Comment)(surgical site, OK to leave open and wash with soap and water rt leg)                       Personal Care Assistance Level of Assistance  Bathing, Feeding, Dressing Bathing Assistance: Limited assistance Feeding assistance: Limited assistance Dressing Assistance: Limited  assistance     Functional Limitations Info  Sight, Hearing, Speech Sight Info: Adequate Hearing Info: Adequate Speech Info: Adequate    SPECIAL CARE FACTORS FREQUENCY  PT (By licensed PT), OT (By licensed OT)     PT Frequency: five times a week OT Frequency: five times a week      Speech Therapy Frequency: five times a week       Contractures Contractures Info: Not present    Additional Factors Info  Code Status, Allergies Code Status Info: full code  Allergies Info: no known allergies            Current Medications (09/09/2018):  This is the current hospital active medication list Current Facility-Administered Medications  Medication Dose Route Frequency Provider Last Rate Last Dose  . 0.9 % NaCl with KCl 20 mEq/ L  infusion   Intravenous Continuous Montez Morita, PA-C 50 mL/hr at 09/06/18 1552    . acetaminophen (TYLENOL) tablet 325-650 mg  325-650 mg Oral Q6H PRN Montez Morita, PA-C      . aspirin chewable tablet 81 mg  81 mg Oral Daily Montez Morita, PA-C   81 mg at 09/09/18 0802  . atorvastatin (LIPITOR) tablet 40 mg  40 mg Oral q1800 Montez Morita, PA-C   40 mg at 09/08/18 1737  . cholecalciferol (VITAMIN D3) tablet 2,000 Units  2,000 Units Oral BID Montez Morita, PA-C   2,000 Units at 09/09/18 2336  . clopidogrel (PLAVIX) tablet 75 mg  75 mg Oral Daily Montez Morita, PA-C   75 mg at 09/09/18 1224  . docusate sodium (COLACE) capsule  100 mg  100 mg Oral BID Montez Morita, PA-C   100 mg at 09/09/18 3546  . enoxaparin (LOVENOX) injection 40 mg  40 mg Subcutaneous Q24H Montez Morita, PA-C   40 mg at 09/09/18 0744  . HYDROcodone-acetaminophen (NORCO) 7.5-325 MG per tablet 1-2 tablet  1-2 tablet Oral Q4H PRN Montez Morita, PA-C   2 tablet at 09/08/18 2100  . HYDROcodone-acetaminophen (NORCO/VICODIN) 5-325 MG per tablet 1-2 tablet  1-2 tablet Oral Q4H PRN Montez Morita, PA-C   2 tablet at 09/08/18 1406  . levothyroxine (SYNTHROID, LEVOTHROID) tablet 175 mcg  175 mcg Oral Q0600 Montez Morita, PA-C    175 mcg at 09/09/18 5681  . lisinopril (PRINIVIL,ZESTRIL) tablet 10 mg  10 mg Oral Daily Montez Morita, PA-C   10 mg at 09/09/18 2751  . methocarbamol (ROBAXIN) tablet 500 mg  500 mg Oral Q6H PRN Montez Morita, PA-C   500 mg at 09/07/18 7001   Or  . methocarbamol (ROBAXIN) 500 mg in dextrose 5 % 50 mL IVPB  500 mg Intravenous Q6H PRN Montez Morita, PA-C      . metoCLOPramide (REGLAN) tablet 5-10 mg  5-10 mg Oral Q8H PRN Montez Morita, PA-C       Or  . metoCLOPramide (REGLAN) injection 5-10 mg  5-10 mg Intravenous Q8H PRN Montez Morita, PA-C      . morphine 2 MG/ML injection 0.5-1 mg  0.5-1 mg Intravenous Q2H PRN Montez Morita, PA-C   1 mg at 09/04/18 1652  . ondansetron (ZOFRAN) tablet 4 mg  4 mg Oral Q6H PRN Montez Morita, PA-C       Or  . ondansetron [facility]) injection 4 mg  4 mg Intravenous Q6H PRN Montez Morita, PA-C      . pantoprazole (PROTONIX) EC tablet 40 mg  40 mg Oral Daily Montez Morita, PA-C   40 mg at 09/09/18 7494  . polyethylene glycol (MIRALAX / GLYCOLAX) packet 17 g  17 g Oral Daily Montez Morita, PA-C   17 g at 09/09/18 4967  . RESOURCE THICKENUP CLEAR   Oral PRN Montez Morita, PA-C      . vitamin C (ASCORBIC ACID) tablet 500 mg  500 mg Oral Daily Montez Morita, PA-C   500 mg at 09/09/18 5916     Discharge Medications: Please see discharge summary for a list of discharge medications.  Relevant Imaging Results:  Relevant Lab Results:   Additional Information SSI number [id number]; date of birth [birth year]  Kingsley Plan, RN

## 2018-09-09 NOTE — Progress Notes (Addendum)
Orthopedic Trauma Service Progress Note  Patient ID: Laurie Valenzuela MRN: [id number] DOB/AGE: 07-08-1955 63 y.o.  Subjective:  Doing well No complaints Will dc home with HH as pt denied CIR by insurance  Ready to go home today   Pt has 4 stairs to get into house   ROS As above  Objective:   VITALS:   Vitals:   09/08/18 0624 09/08/18 1636 09/08/18 2210 09/09/18 0437  BP: 114/73 106/73 126/90 129/79  Pulse: (!) 55 (!) 59 65 60  Resp: 18 18 16 16   Temp: 97.7 F (36.5 C) 98.3 F (36.8 C) 98.1 F (36.7 C) 98.3 F (36.8 C)  TempSrc: Oral Oral Oral Oral  SpO2: 95% 95% 96% 94%  Height:        Estimated body mass index is 34.19 kg/m as calculated from the following:   Height as of this encounter: 5\' 8"  (1.727 m).   Weight as of 09/01/18: 102 kg.   Intake/Output      03/10 0701 - 03/11 0700 03/11 0701 - 03/12 0700   P.O. 900    Total Intake 900    Urine 850    Total Output 850    Net +50           LABS  No results found for this or any previous visit (from the past 24 hour(s)).   PHYSICAL EXAM:   Gen: sitting in bedside chair, looks good, NAD Lungs: unlabored Cardiac: regular  Ext:       Right Lower Extremity   All incisions look great  No signs of infection   Mild swelling to R leg   Compartments are soft  No pain with passive stretch  Motor functions are weak but intact, this is baseline since stroke  DPN, SPN, TN sensation grossly intact   Full knee extension achieved passively  Pt wearing PRAFO boot   Assessment/Plan: 6 Days Post-Op   Principal Problem:   Fracture of tibial shaft, right, closed Active Problems:   Cerebrovascular accident (CVA) (HCC)   Essential (primary) hypertension   Nicotine dependence   Hyperlipidemia   Anti-infectives (From admission, onward)   Start     Dose/Rate Route Frequency Ordered Stop   09/03/18 1200  cefTRIAXone  (ROCEPHIN) 1 g in sodium chloride 0.9 % 100 mL IVPB  Status:  Discontinued     1 g 200 mL/hr over 30 Minutes Intravenous Every 24 hours 09/03/18 1117 09/07/18 1238   09/03/18 0805  ceFAZolin (ANCEF) 2-4 GM/100ML-% IVPB    Note to Pharmacy:  [name]   : cabinet override      09/03/18 0805 09/03/18 1145    .  POD/HD#: 68  63 y/o female with numerous medical problems including stroke approximately 10 days ago s/p fall with closed R distal 1/3 tibial shaft fracture    -closed R distal 1/3 tibial shaft fracture s/p IMN              PWB R leg, 50 % in CAM              Unrestricted ROM R ankle and knee                         PRAFO on when not ambulating. Alternatively can wear CAM during  the day and PRAFO at night               PT/OT evals                              PT- please teach HEP for R knee ROM- AROM, PROM. Prone exercises as well. No ROM restrictions.  Quad sets, SLR, LAQ, SAQ, heel slides, stretching, prone flexion and extension                             Ankle theraband program, heel cord stretching, toe towel curls, etc. Passive ankle ROM as well                              No pillows under bend of knee when at rest, ok to place under heel to help work on extension. Can also use zero knee bone foam if available                 Ice and elevate Leg for swelling and pain control              Dressing changes as needed                          Ok to leave uncovered                         Ok to wash with soap and water only at this time    TED hose for swelling control    - Pain management:             continue tylenol and norco at dc    - ABL anemia/Hemodynamics             Stable    - Medical issues              Home meds             Stable     - DVT/PE prophylaxis:             On DAPT (plavix and asa)             Lovenox x 8 more days      - Metabolic Bone Disease:             vitamin d low end of normal                          Supplement               Recommend DEXA as an outpt             Pt with numerous RF for osteoporosis    - Activity:             Up with therapies              PWB R leg (50%) in CAM   - FEN/GI prophylaxis/Foley/Lines:             Reg diet with restrictions as noted by SLP             SLP recs  Diet recommendations: Regular;Thin liquid Liquids provided via: Cup;No straw Medication Administration: Whole meds with puree Supervision: Patient able  to self feed;Intermittent supervision to cue for compensatory strategies Compensations: Slow rate;Small sips/bites;Chin tuck Postural Changes and/or Swallow Maneuvers: Seated upright 90 degrees;Chin tuck    - Impediments to fracture healing:             Poor bone density              Stroke (increased fall risk?)             Nicotine dependence              Thyroid disease    - Dispo:            dc home today with HH   Follow up in 10 days with ortho   Supposed to follow up with neurology in 6 weeks ([facility]) which was noted on discharge on 08/30/2018  Resume outpt OT/PT/SLP asap, referral made after original admission for stroke    As of 09/07/2018 neurorehab did reach out to pt and family     Mearl Latin, PA-C [phone] (C) 09/09/2018, 9:50 AM  [facility] [address] [place] [address] [phone] [name] (F)

## 2018-09-09 NOTE — Plan of Care (Signed)
  Problem: Education: Goal: Knowledge of General Education information will improve Description Including pain rating scale, medication(s)/side effects and non-pharmacologic comfort measures Outcome: Progressing   Problem: Health Behavior/Discharge Planning: Goal: Ability to manage health-related needs will improve Outcome: Progressing   

## 2018-09-09 NOTE — Discharge Summary (Addendum)
Orthopaedic Trauma Service (OTS) Discharge Summary   Addendum:  Patient was supposed to discharge yesterday 09/10/2018 however the facility she was going to discharge to requiring a coronavirus screen.  This was reviewed with social work Interior and spatial designer and ultimately the skilled nursing center is no longer accepting new patients per company policy.  New facility has been selected and insurance authorization is been obtained as of 09/11/2018.  Patient remains medically stable for discharge.  No acute issues were noted on 08/12/2018.  Again patient remained stable.   Patient ID: Laurie Valenzuela MRN: [id number] DOB/AGE: [birth year] 63 y.o.  Admit date: 09/02/2018 Discharge date: 09/11/2018  Admission Diagnoses: Closed right distal third tibial shaft fracture Closed proximal fibula fracture Recent history of CVA  Hypertension Nicotine dependence Hyperlipidemia  Discharge Diagnoses:  Principal Problem:   Fracture of tibial shaft, right, closed Active Problems:   Cerebrovascular accident (CVA) (HCC)   Essential (primary) hypertension   Nicotine dependence   Hyperlipidemia   Past Medical History:  Diagnosis Date   HTN (hypertension)    Hyperlipidemia    Hypothyroidism    Stroke (HCC)    2017     Procedures Performed:  09/03/2018-Dr. Carola Frost INTRAMEDULLARY (IM) NAIL TIBIAL (Right) with Versanail 10 x statically locked    Discharged Condition: good  Hospital Course:   Patient is a 63 year old white female who sustained a stroke on 08/29/2019.  She was subsequently discharged on 08/30/2018 and unfortunately sustained a fall on 09/01/2018 with resultant right tibia and fibula fractures.  Patient was initially seen and evaluated at [facility] but was subsequently transferred to [facility] for definitive treatment.  Patient was admitted to the orthopedic trauma service.  She was taken to the operating room on 09/03/2018 with the procedure noted  above was performed.  Patient's hospital stay was really uncomplicated.  There were no perioperative issues of note.  We did note that her admission urinalysis was little suspect that she was started antibiotics given proximity to surgical procedure.  Cultures ultimately came back without significant growth and antibiotics were stopped.  Patient had to be evaluations by SLP including bedside evaluation as well as her MBS.  Recommendations made by SLP will be noted at the bottom of the section.   Patient worked well with therapies.  Her pain is very well controlled during her hospital stay she was started on Lovenox on postoperative day #1 as well in addition to resuming her dual antiplatelet therapy.  Again no significant issues were noted.  Dressing was changed on postoperative day #2 wounds were stable.  She was placed in a PRAFO to maintain neutral ankle position as she does have some residual weakness in her foot and ankle likely related to history of strokes.  She is allowed to be partial weightbearing approximately 50% of the body weight in a cam boot.  Given her medical comorbidities and her acute tibia fracture we did think that she would be a appropriate candidate for inpatient rehab.  A consult was placed.  Inpatient rehab did not hear back from the insurance company until afternoon of 09/08/2018.  Initially patient thought that she would discharge home with home health however after additional discussion with her family they decided to discharge to SNF.  Patient is stable for discharge to skilled nursing facility on 09/09/2018  SLP recs   Diet recommendations: Regular;Thin liquid Liquids provided via: Cup;No straw Medication Administration: Whole meds with puree Supervision: Patient able to self feed;Intermittent supervision to cue for  compensatory strategies Compensations: Slow rate;Small sips/bites;Chin tuck Postural Changes and/or Swallow Maneuvers: Seated upright 90 degrees;Chin  tuck    Consults: rehabilitation medicine  Significant Diagnostic Studies: labs:   Results for SHEFALI, NG (MRN [id number]) as of 09/09/2018 11:08  Ref. Range 09/05/2018 03:00  Sodium Latest Ref Range: 135 - 145 mmol/L 137  Potassium Latest Ref Range: 3.5 - 5.1 mmol/L 4.1  Chloride Latest Ref Range: 98 - 111 mmol/L 104  CO2 Latest Ref Range: 22 - 32 mmol/L 23  Glucose Latest Ref Range: 70 - 99 mg/dL 409 (H)  BUN Latest Ref Range: 8 - 23 mg/dL 16  Creatinine Latest Ref Range: 0.44 - 1.00 mg/dL 8.11  Calcium Latest Ref Range: 8.9 - 10.3 mg/dL 8.8 (L)  Anion gap Latest Ref Range: 5 - 15  10  Calcium, Ionized, Serum Latest Ref Range: 4.5 - 5.6 mg/dL 5.0  Alkaline Phosphatase Latest Ref Range: 38 - 126 U/L 67  Albumin Latest Ref Range: 3.5 - 5.0 g/dL 3.1 (L)  AST Latest Ref Range: 15 - 41 U/L 17  ALT Latest Ref Range: 0 - 44 U/L 22  Total Protein Latest Ref Range: 6.5 - 8.1 g/dL 6.4 (L)  Total Bilirubin Latest Ref Range: 0.3 - 1.2 mg/dL 0.7  PREALBUMIN Latest Ref Range: 18 - 38 mg/dL 91.4 (L)  GFR, Est Non African American Latest Ref Range: >60 mL/min >60  GFR, Est African American Latest Ref Range: >60 mL/min >60  Vit D, 1,25-Dihydroxy Latest Ref Range: 19.9 - 79.3 pg/mL 28.9  Vitamin D, 25-Hydroxy Latest Ref Range: 30.0 - 100.0 ng/mL 30.0  WBC Latest Ref Range: 4.0 - 10.5 K/uL 10.2  RBC Latest Ref Range: 3.87 - 5.11 MIL/uL 3.58 (L)  Hemoglobin Latest Ref Range: 12.0 - 15.0 g/dL 78.2 (L)  HCT Latest Ref Range: 36.0 - 46.0 % 34.1 (L)  MCV Latest Ref Range: 80.0 - 100.0 fL 95.3  MCH Latest Ref Range: 26.0 - 34.0 pg 31.3  MCHC Latest Ref Range: 30.0 - 36.0 g/dL 95.6  RDW Latest Ref Range: 11.5 - 15.5 % 11.8  Platelets Latest Ref Range: 150 - 400 K/uL 230  nRBC Latest Ref Range: 0.0 - 0.2 % 0.0  PTH, Intact Latest Ref Range: 15 - 65 pg/mL 29  Calcium, Total (PTH) Latest Ref Range: 8.7 - 10.3 mg/dL 8.8  PTH Interp Unknown Comment    Treatments: IV hydration,  antibiotics: Ancef and ceftriaxone, analgesia: acetaminophen and norco, anticoagulation: ASA, Plavix and LMW heparin, therapies: PT, OT, RN and SW and surgery: as above   Discharge Exam:  Orthopedic Trauma Service Progress Note  Patient ID: Laurie Valenzuela MRN: [id number] DOB/AGE: [birth year] 63 y.o.  Subjective:  Patient remains inpatient as skilled nursing facility she was supposed to go to yesterday was requiring a coronavirus screen.  Ultimately that skilled nursing center was no longer accepting new patients per company policy  Patient selected new skilled center and insurance authorization is pending.  Patient remained stable, no acute changes she remains medically stable for discharge   ROS As above  Objective:   VITALS:   Vitals:   09/10/18 1334 09/10/18 2254 09/11/18 0407 09/11/18 1031  BP: 107/75 131/85 138/87 127/76  Pulse: (!) 54 (!) 59 (!) 56   Resp:  16 16   Temp: 98.2 F (36.8 C) 98.2 F (36.8 C) 97.8 F (36.6 C)   TempSrc: Oral Oral Oral   SpO2: 96% 94% 96%   Height:  Estimated body mass index is 34.19 kg/m as calculated from the following:   Height as of this encounter: 5\' 8"  (1.727 m).   Weight as of 09/01/18: 102 kg.   Intake/Output      03/12 0701 - 03/13 0700 03/13 0701 - 03/14 0700   P.O.  360   Total Intake  360   Urine 400    Total Output 400    Net -400 +360        Urine Occurrence 1 x      LABS  LabResultsLast24Hours  No results found for this or any previous visit (from the past 24 hour(s)).     PHYSICAL EXAM:   NWG:NFAOZHYGen:sitting in bedside chair, looks good, NAD Lungs:unlabored Cardiac:regular Ext: Right Lower Extremity  All incisions look great No signs of infection  Mild swelling to R leg  Compartments are soft No pain with passive stretch Motor functions are weak but intact, this is  baseline since stroke DPN, SPN, TN sensation grossly intact  Full knee extension achieved passively Pt wearing PRAFO boot    Assessment/Plan: 8 Days Post-Op   Principal Problem:   Fracture of tibial shaft, right, closed Active Problems:   Cerebrovascular accident (CVA) (HCC)   Essential (primary) hypertension   Nicotine dependence   Hyperlipidemia              Anti-infectives (From admission, onward)   Start     Dose/Rate Route Frequency Ordered Stop   09/03/18 1200  cefTRIAXone (ROCEPHIN) 1 g in sodium chloride 0.9 % 100 mL IVPB  Status:  Discontinued     1 g 200 mL/hr over 30 Minutes Intravenous Every 24 hours 09/03/18 1117 09/07/18 1238   09/03/18 0805  ceFAZolin (ANCEF) 2-4 GM/100ML-% IVPB    Note to Pharmacy:  Annabelle HarmanSmith, Alyssia   : cabinet override      09/03/18 0805 09/03/18 1145    .  POD/HD#: 248   63 y/o female with numerous medical problems including stroke approximately 10 daysago s/p fall with closed R distal 1/3 tibial shaft fracture  -closed R distal 1/3 tibial shaft fracture s/p IMN  PWB R leg, 50 % in CAM  Unrestricted ROM R ankle and knee PRAFO on when not ambulating. Alternatively can wear CAM during the day and PRAFO at night  PT/OT evals   PT- please teach HEP forRknee ROM- AROM, PROM. Prone exercises as well. No ROM restrictions. Quad sets, SLR, LAQ, SAQ, heel slides, stretching, prone flexion and extension  Ankle theraband program, heel cord stretching, toe towel curls, etc. Passive ankle ROM as well  No pillows under bend of knee when at rest, ok to place under heel to help work on extension. Can also use zero knee bone foam if available   Ice and elevate Leg for swelling and pain control  Dressing changes as  needed Ok to leave uncovered Ok to wash with soap and water only at this time  TED hose for swelling control  - Pain management: continue tylenol and norco at dc  - ABL anemia/Hemodynamics Stable   - Medical issues Home meds Stable   - DVT/PE prophylaxis: On DAPT (plavix and asa) Lovenox [id number]   - Metabolic Bone Disease: vitamin d low end of normal  Supplement  Recommend DEXA as an outpt Pt with numerous RF for osteoporosis   - Activity: Up with therapies  PWB R leg (50%) in CAM  - FEN/GI prophylaxis/Foley/Lines: Reg diet with restrictions as noted by SLP  SLP  recs  Diet recommendations: Regular;Thin liquid Liquids provided via: Cup;No straw Medication Administration: Whole meds with puree Supervision: Patient able to self feed;Intermittent supervision to cue for compensatory strategies Compensations: Slow rate;Small sips/bites;Chin tuck Postural Changes and/or Swallow Maneuvers: Seated upright 90 degrees;Chin tuck    - Impediments to fracture healing: Poor bone density  Stroke (increased fall risk?) Nicotine dependence  Thyroid disease   - Dispo:             Dc to snf once authorization completed             Pt remains medically stable                  Disposition: Discharge disposition: 03-Skilled Nursing Facility       Discharge Instructions    Call MD / Call 911   Complete by:  As directed    If you experience chest pain or shortness of breath, CALL 911 and be transported to the hospital emergency room.  If you develope a fever above 101 F, pus (white drainage) or increased drainage or redness at the wound, or calf pain, call your  surgeon's office.   Constipation Prevention   Complete by:  As directed    Drink plenty of fluids.  Prune juice may be helpful.  You may use a stool softener, such as Colace (over the counter) 100 mg twice a day.  Use MiraLax (over the counter) for constipation as needed.   Diet general   Complete by:  As directed    Diet recommendations: Regular;Thin liquid Liquids provided via: Cup;No straw Medication Administration: Whole meds with puree Supervision: Patient able to self feed;Intermittent supervision to cue for compensatory strategies Compensations: Slow rate;Small sips/bites;Chin tuck Postural Changes and/or Swallow Maneuvers: Seated upright 90 degrees;Chin tuck   Discharge instructions   Complete by:  As directed    Orthopaedic Trauma Service Discharge Instructions   General Discharge Instructions  WEIGHT BEARING STATUS: Partial weightbearing right leg, 50%  RANGE OF MOTION/ACTIVITY: Unrestricted range of motion right knee and ankle.  Activity as tolerated.  Up with assistance with walker to assist with ambulation.  Must wear cam boot when ambulating, wear PRAFO boot (gray boot) when at rest or at night   Wound Care: Daily wound care as needed starting immediately.  Okay to leave wounds open to the air as they are dry.  Can use TED hose to help with swelling control.  Okay to shower and clean wounds with soap and water only.  Do not use any lotion, ointments or solutions such as hydrogen peroxide or Betadine to the wounds.  Discharge Wound Care Instructions  Do NOT apply any ointments, solutions or lotions to pin sites or surgical wounds.  These prevent needed drainage and even though solutions like hydrogen peroxide kill bacteria, they also damage cells lining the pin sites that help fight infection.  Applying lotions or ointments can keep the wounds moist and can cause them to breakdown and open up as well. This can increase the risk for infection. When in doubt call the  office.  Surgical incisions should be dressed daily.  If any drainage is noted, use one layer of adaptic, then gauze, Kerlix, and an ace wrap.  Once the incision is completely dry and without drainage, it may be left open to air out.  Showering may begin 36-48 hours later.  Cleaning gently with soap and water.  Traumatic wounds should be dressed daily as well.    One layer  of adaptic, gauze, Kerlix, then ace wrap.  The adaptic can be discontinued once the draining has ceased    If you have a wet to dry dressing: wet the gauze with saline the squeeze as much saline out so the gauze is moist (not soaking wet), place moistened gauze over wound, then place a dry gauze over the moist one, followed by Kerlix wrap, then ace wrap.   DVT/PE prophylaxis: Continue on Lovenox 1 injection daily for the next days.  Also continue to take your Plavix and aspirin which were prescribed to you after your first admission after your stroke  Diet: See the recommendations below were provided after your swallowing evaluations including modified barium swallow study     Diet recommendations: Regular;Thin liquid Liquids provided via: Cup;No straw Medication Administration: Whole meds with puree Supervision: Patient able to self feed;Intermittent supervision to cue for compensatory strategies Compensations: Slow rate;Small sips/bites;Chin tuck Postural Changes and/or Swallow Maneuvers: Seated upright 90 degrees;Chin tuck    PAIN MEDICATION USE AND EXPECTATIONS  You have likely been given narcotic medications to help control your pain.  After a traumatic event that results in an fracture (broken bone) with or without surgery, it is ok to use narcotic pain medications to help control one's pain.  We understand that everyone responds to pain differently and each individual patient will be evaluated on a regular basis for the continued need for narcotic medications. Ideally, narcotic medication use should last no more  than 6-8 weeks (coinciding with fracture healing).   As a patient it is your responsibility as well to monitor narcotic medication use and report the amount and frequency you use these medications when you come to your office visit.   We would also advise that if you are using narcotic medications, you should take a dose prior to therapy to maximize you participation.  IF YOU ARE ON NARCOTIC MEDICATIONS IT IS NOT PERMISSIBLE TO OPERATE A MOTOR VEHICLE (MOTORCYCLE/CAR/TRUCK/MOPED) OR HEAVY MACHINERY DO NOT MIX NARCOTICS WITH OTHER CNS (CENTRAL NERVOUS SYSTEM) DEPRESSANTS SUCH AS ALCOHOL   STOP SMOKING OR USING NICOTINE PRODUCTS!!!!  As discussed nicotine severely impairs your body's ability to heal surgical and traumatic wounds but also impairs bone healing.  Wounds and bone heal by forming microscopic blood vessels (angiogenesis) and nicotine is a vasoconstrictor (essentially, shrinks blood vessels).  Therefore, if vasoconstriction occurs to these microscopic blood vessels they essentially disappear and are unable to deliver necessary nutrients to the healing tissue.  This is one modifiable factor that you can do to dramatically increase your chances of healing your injury.    (This means no smoking, no nicotine gum, patches, etc)  DO NOT USE NONSTEROIDAL ANTI-INFLAMMATORY DRUGS (NSAID'S)  Using products such as Advil (ibuprofen), Aleve (naproxen), Motrin (ibuprofen) for additional pain control during fracture healing can delay and/or prevent the healing response.  If you would like to take over the counter (OTC) medication, Tylenol (acetaminophen) is ok.  However, some narcotic medications that are given for pain control contain acetaminophen as well. Therefore, you should not exceed more than 4000 mg of tylenol in a day if you do not have liver disease.  Also note that there are may OTC medicines, such as cold medicines and allergy medicines that my contain tylenol as well.  If you have any questions  about medications and/or interactions please ask your doctor/PA or your pharmacist.      ICE AND ELEVATE INJURED/OPERATIVE EXTREMITY  Using ice and elevating the injured extremity above your  heart can help with swelling and pain control.  Icing in a pulsatile fashion, such as 20 minutes on and 20 minutes off, can be followed.    Do not place ice directly on skin. Make sure there is a barrier between to skin and the ice pack.    Using frozen items such as frozen peas works well as the conform nicely to the are that needs to be iced.  USE AN ACE WRAP OR TED HOSE FOR SWELLING CONTROL  In addition to icing and elevation, Ace wraps or TED hose are used to help limit and resolve swelling.  It is recommended to use Ace wraps or TED hose until you are informed to stop.    When using Ace Wraps start the wrapping distally (farthest away from the body) and wrap proximally (closer to the body)   Example: If you had surgery on your leg or thing and you do not have a splint on, start the ace wrap at the toes and work your way up to the thigh        If you had surgery on your upper extremity and do not have a splint on, start the ace wrap at your fingers and work your way up to the upper arm   IF YOU ARE IN A CAM BOOT (BLACK BOOT)  You may remove boot periodically. Perform daily dressing changes as noted below.  Wash the liner of the boot regularly and wear a sock when wearing the boot. It is recommended that you sleep in the boot until told otherwise  CALL THE OFFICE WITH ANY QUESTIONS OR CONCERNS: [phone]   Driving restrictions   Complete by:  As directed    No driving until further notice   Increase activity slowly as tolerated   Complete by:  As directed    Partial weight bearing   Complete by:  As directed    % Body Weight:  50%   Laterality:  right   Extremity:  Lower     Allergies as of 09/09/2018   No Known Allergies     Medication List    STOP taking these medications   cephALEXin  500 MG capsule Commonly known as:  KEFLEX     TAKE these medications   acetaminophen 325 MG tablet Commonly known as:  TYLENOL Take 1-2 tablets (325-650 mg total) by mouth every 6 (six) hours as needed for mild pain (pain score 1-3 or temp > 100.5).   ascorbic acid 500 MG tablet Commonly known as:  VITAMIN C Take 1 tablet (500 mg total) by mouth daily. Start taking on:  September 10, 2018   aspirin 81 MG chewable tablet Chew 1 tablet (81 mg total) by mouth daily for 10 days. Start taking on:  September 10, 2018   atorvastatin 40 MG tablet Commonly known as:  LIPITOR Take 1 tablet (40 mg total) by mouth daily at 6 PM for 30 days.   clopidogrel 75 MG tablet Commonly known as:  PLAVIX Take 1 tablet (75 mg total) by mouth daily. Start taking on:  September 10, 2018   docusate sodium 100 MG capsule Commonly known as:  COLACE Take 1 capsule (100 mg total) by mouth 2 (two) times daily.   enoxaparin 40 MG/0.4ML injection Commonly known as:  LOVENOX Inject 0.4 mLs (40 mg total) into the skin daily. Start taking on:  September 10, 2018   HYDROcodone-acetaminophen 5-325 MG tablet Commonly known as:  NORCO/VICODIN Take 1-2 tablets by mouth every  6 (six) hours as needed for moderate pain or severe pain (pain score 4-6).   levothyroxine 175 MCG tablet Commonly known as:  SYNTHROID, LEVOTHROID Take 1 tablet by mouth daily.   lisinopril 10 MG tablet Commonly known as:  PRINIVIL,ZESTRIL Take 1 tablet (10 mg total) by mouth daily for 30 days.   multivitamin with minerals tablet Take 1 tablet by mouth daily.   Resource ThickenUp Clear Powd Use to make liquids nectar thick as needed   Vitamin D3 125 MCG (5000 UT) Tabs Take 1 tablet (5,000 Units total) by mouth daily.            Durable Medical Equipment  (From admission, onward)         Start     Ordered   09/09/18 1024  For home use only DME standard manual wheelchair with seat cushion  Once    Comments:  Patient suffers from stroke  and R tibia fracture which impairs their ability to perform daily activities like bathing, dressing, feeding, grooming and toileting in the home.  A cane or crutch will not resolve  issue with performing activities of daily living. A wheelchair will allow patient to safely perform daily activities. Patient can safely propel the wheelchair in the home or has a caregiver who can provide assistance.  Accessories: elevating leg rests (ELRs), wheel locks, extensions and anti-tippers.   09/09/18 1023   09/09/18 1018  For home use only DME Walker rolling  Once    Question:  Patient needs a walker to treat with the following condition  Answer:  Fracture of tibial shaft, right, closed   09/09/18 1017   09/09/18 1018  For home use only DME 3 n 1  Once     09/09/18 1017           Discharge Care Instructions  (From admission, onward)         Start     Ordered   09/09/18 0000  Partial weight bearing    Question Answer Comment  % Body Weight 50%   Laterality right   Extremity Lower      09/09/18 1021         Follow-up Information    Myrene Galas, MD. Schedule an appointment as soon as possible for a visit in 10 day(s).   Specialty:  Orthopedic Surgery Contact information: [address] [place] [address] [phone]        [facility]. Schedule an appointment as soon as possible for a visit in 6 week(s).   Why:  make follow up appointment if not done so already  Contact information: [address]     [address] [place] [address] [phone]          Discharge Instructions and Plan:  63 y/o female with numerous medical problems including stroke approximately 10 daysago s/p fall with closed R distal 1/3 tibial shaft fracture  -closed R distal 1/3 tibial shaft fracture s/p IMN  PWB R leg, 50 % in CAM  Unrestricted ROM R ankle and knee PRAFO on when not ambulating.  Alternatively can wear CAM during the day and PRAFO at night  PT/OT evals   PT- please teach HEP forRknee ROM- AROM, PROM. Prone exercises as well. No ROM restrictions. Quad sets, SLR, LAQ, SAQ, heel slides, stretching, prone flexion and extension  Ankle theraband program, heel cord stretching, toe towel curls, etc. Passive ankle ROM as well  No pillows under bend of knee when at  rest, ok to place under heel to help work on extension. Can also use zero knee bone foam if available   Ice and elevate Leg for swelling and pain control  Dressing changes as needed Ok to leave uncovered Ok to wash with soap and water only at this time  TED hose for swelling control  - Pain management: continue tylenol and norco at dc  - ABL anemia/Hemodynamics Stable   - Medical issues Home meds Stable   - DVT/PE prophylaxis: On DAPT (plavix and asa) Lovenox x3moredays   - Metabolic Bone Disease: vitamin d low end of normal  Supplement  Recommend DEXA as an outpt Pt with numerous RF for osteoporosis   - Activity: Up with therapies  PWB R leg (50%) in CAM  - FEN/GI prophylaxis/Foley/Lines: Reg diet with restrictions as noted by SLP  SLP recs  Diet recommendations: Regular;Thin liquid Liquids provided via: Cup;No straw Medication Administration: Whole meds with puree Supervision: Patient able to self feed;Intermittent supervision to cue for compensatory strategies Compensations: Slow rate;Small sips/bites;Chin tuck Postural Changes and/or Swallow Maneuvers: Seated upright 90 degrees;Chin tuck    - Impediments to  fracture healing: Poor bone density  Stroke (increased fall risk?) Nicotine dependence  Thyroid disease   - Dispo:             Dc to snf once authorization completed             Pt remains medically stable                Signed:  Mearl Latin, PA-C [phone] (C) 09/09/2018, 11:18 AM  [facility] [address] [place] [address] [phone] [name] (F)

## 2018-09-09 NOTE — Progress Notes (Signed)
[facility] Follow up   Pt had further discussions with family  Has now decided to go to snf  Dc [facility]s [facility] hospital. I communicated with them so they would not be filled  Rxs will be changed to reflect dc to SNF  Please see earlier PN   Mearl Latin, PA-C [phone] (C) 09/09/2018, 10:50 AM  [facility] [address] [place] [address] [phone] [name] (F)

## 2018-09-10 ENCOUNTER — Ambulatory Visit: Payer: Managed Care, Other (non HMO) | Admitting: Occupational Therapy

## 2018-09-10 ENCOUNTER — Ambulatory Visit: Payer: Managed Care, Other (non HMO) | Admitting: Speech Pathology

## 2018-09-10 ENCOUNTER — Encounter: Payer: Self-pay | Admitting: Physical Therapy

## 2018-09-10 LAB — CREATININE, SERUM
Creatinine, Ser: 0.7 mg/dL (ref 0.44–1.00)
GFR calc Af Amer: >60 mL/min (ref >60)
GFR calc non Af Amer: >60 mL/min (ref >60)

## 2018-09-10 NOTE — Social Work (Addendum)
4:26pm- Pt and pt partner have chosen [facility], will alert admissions liaison Olegario Messier and call Dartha Lodge with [facility] regarding new facility choice.   3:41pm- Per Assistant Director Wandra Mannan pt will need to select a new SNF. Pt preferred SNF is no longer admitting any further pt's per company policy.   2:16pm- Pt rehab is requesting screen for coronavirus prior to pt admission. CSW has reached out to Chiropodist Wandra Mannan for further guidance.  11:19am- Pt has been arranged for SNF placement at [facility].  Per Rosann Auerbach representative Dartha Lodge pt is approved for SNF placement.   Sent discharge information to [facility], await confirmation for transfer.  Octavio Graves, MSW, [facility] Clinical Social Work [phone]
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
## 2018-09-10 NOTE — Therapy (Signed)
[facility] [address] [place], [place], [address] Phone: [phone]   Fax:  [phone]  Patient Details  Name: Laurie Valenzuela MRN: [id number] Date of Birth: Nov 05, 1955 Referring Provider:  No ref. provider found  Encounter Date: 09/10/2018  PHYSICAL THERAPY DISCHARGE SUMMARY  Visits from Start of Care: 1  Current functional level related to goals / functional outcomes: Pt hospitalized and plans to go to SNF; did not return after evaluation   Remaining deficits: Pt hospitalized and plans to go to SNF; did not return after evaluation   Education / Equipment: Pt hospitalized and plans to go to SNF; did not return after evaluation  Plan: Patient agrees to discharge.  Patient goals were not met. Patient is being discharged due to a change in medical status.  ?????        Rexanne Mano, PT 09/10/2018, 7:35 PM  [facility] [address] [place], [place], [address] Phone: [phone]   Fax:  [phone]

## 2018-09-11 ENCOUNTER — Encounter

## 2018-09-11 NOTE — Progress Notes (Signed)
Orthopedic Trauma Service Progress Note  Patient ID: Laurie Valenzuela MRN: [id number] DOB/AGE: [birth year] 63 y.o.  Subjective:  Patient remains inpatient as skilled nursing facility she was supposed to go to yesterday was requiring a coronavirus screen.  Ultimately that skilled nursing center was no longer accepting new patients per company policy  Patient selected new skilled center and insurance authorization is pending.  Patient remained stable, no acute changes she remains medically stable for discharge   ROS As above  Objective:   VITALS:   Vitals:   09/10/18 1334 09/10/18 2254 09/11/18 0407 09/11/18 1031  BP: 107/75 131/85 138/87 127/76  Pulse: (!) 54 (!) 59 (!) 56   Resp:  16 16   Temp: 98.2 F (36.8 C) 98.2 F (36.8 C) 97.8 F (36.6 C)   TempSrc: Oral Oral Oral   SpO2: 96% 94% 96%   Height:        Estimated body mass index is 34.19 kg/m as calculated from the following:   Height as of this encounter: 5\' 8"  (1.727 m).   Weight as of 09/01/18: 102 kg.   Intake/Output      03/12 0701 - 03/13 0700 03/13 0701 - 03/14 0700   P.O.  360   Total Intake  360   Urine 400    Total Output 400    Net -400 +360        Urine Occurrence 1 x      LABS  No results found for this or any previous visit (from the past 24 hour(s)).   PHYSICAL EXAM:    Gen: sitting in bedside chair, looks good, NAD Lungs: unlabored Cardiac: regular  Ext:       Right Lower Extremity              All incisions look great             No signs of infection              Mild swelling to R leg              Compartments are soft             No pain with passive stretch             Motor functions are weak but intact, this is baseline since stroke             DPN, SPN, TN sensation grossly intact              Full knee extension achieved passively             Pt wearing PRAFO boot     Assessment/Plan: 8 Days Post-Op   Principal Problem:   Fracture of tibial shaft, right, closed Active Problems:   Cerebrovascular accident (CVA) (HCC)   Essential (primary) hypertension   Nicotine dependence   Hyperlipidemia   Anti-infectives (From admission, onward)   Start     Dose/Rate Route Frequency Ordered Stop   09/03/18 1200  cefTRIAXone (ROCEPHIN) 1 g in sodium chloride 0.9 % 100 mL IVPB  Status:  Discontinued     1 g 200 mL/hr over 30 Minutes Intravenous Every 24 hours 09/03/18 1117 09/07/18 1238   09/03/18 0805  ceFAZolin (ANCEF) 2-4 GM/100ML-% IVPB    Note to Pharmacy:  [name]   : cabinet override      09/03/18 0805 09/03/18 1145    .  POD/HD#: 16   63 y/o female with numerous medical problems including stroke approximately 10 days ago s/p fall with closed R distal 1/3 tibial shaft fracture    -closed R distal 1/3 tibial shaft fracture s/p IMN              PWB R leg, 50 % in CAM              Unrestricted ROM R ankle and knee                         PRAFO on when not ambulating. Alternatively can wear CAM during the day and PRAFO at night               PT/OT evals                              PT- please teach HEP for R knee ROM- AROM, PROM. Prone exercises as well. No ROM restrictions.  Quad sets, SLR, LAQ, SAQ, heel slides, stretching, prone flexion and extension                             Ankle theraband program, heel cord stretching, toe towel curls, etc. Passive ankle ROM as well                              No pillows under bend of knee when at rest, ok to place under heel to help work on extension. Can also use zero knee bone foam if available                 Ice and elevate Leg for swelling and pain control              Dressing changes as needed                          Ok to leave uncovered                         Ok to wash with soap and water only at this time                TED hose for swelling control    - Pain management:              continue tylenol and norco at dc    - ABL anemia/Hemodynamics             Stable    - Medical issues              Home meds             Stable     - DVT/PE prophylaxis:             On DAPT (plavix and asa)             Lovenox x 8 more days      - Metabolic Bone Disease:             vitamin d low end of normal  Supplement              Recommend DEXA as an outpt             Pt with numerous RF for osteoporosis    - Activity:             Up with therapies              PWB R leg (50%) in CAM   - FEN/GI prophylaxis/Foley/Lines:             Reg diet with restrictions as noted by SLP                                   SLP recs   Diet recommendations: Regular;Thin liquid Liquids provided via: Cup;No straw Medication Administration: Whole meds with puree Supervision: Patient able to self feed;Intermittent supervision to cue for compensatory strategies Compensations: Slow rate;Small sips/bites;Chin tuck Postural Changes and/or Swallow Maneuvers: Seated upright 90 degrees;Chin tuck      - Impediments to fracture healing:             Poor bone density              Stroke (increased fall risk?)             Nicotine dependence              Thyroid disease    - Dispo:  Dc to snf once authorization completed  Pt remains medically stable     Mearl Latin, PA-C [phone] (C) 09/11/2018, 11:16 AM  [facility] [address] [place] [address] [phone] [name] (F)

## 2018-09-11 NOTE — Social Work (Signed)
Authorization received for [facility].  Clinical Social Worker facilitated patient discharge including contacting patient family and facility to confirm patient discharge plans.  Clinical information faxed to facility and family agreeable with plan.  CSW arranged ambulance transport via [facility] to [facility] at 3pm. RN to call [phone]  with report prior to discharge.  Clinical Social Worker will sign off for now as social work intervention is no longer needed. Please consult [place] again if new need arises.  Octavio Graves, MSW, [facility] Clinical Social Worker [phone]

## 2018-09-11 NOTE — Social Work (Signed)
Spoke with pt and updated her that we are awaiting [employer] authorization for [facility], when that is received will dc pt. She is in agreement for the plan.  Octavio Graves, MSW, [facility] Clinical Social Work [phone]

## 2018-09-14 ENCOUNTER — Ambulatory Visit: Payer: Managed Care, Other (non HMO) | Admitting: Physical Therapy

## 2018-09-14 ENCOUNTER — Ambulatory Visit: Payer: Managed Care, Other (non HMO) | Admitting: Occupational Therapy

## 2018-09-14 NOTE — Therapy (Signed)
[facility] [address] [place], [place], [address] Phone: [phone]   Fax:  [phone]  Patient Details  Name: Laurie Valenzuela MRN: [id number] Date of Birth: 12/04/1955 Referring Provider:  No ref. provider found  Encounter Date: 09/14/2018  Pt did not return after initial evaluation d/t hospitalization and being discharge to SNF. Will d/c episode of care at this time  Kelli Churn, OTR/L 09/14/2018, 12:21 PM  [facility] [address] [place], [place], [address] Phone: [phone]   Fax:  [phone]

## 2018-09-18 ENCOUNTER — Ambulatory Visit: Payer: Managed Care, Other (non HMO) | Admitting: Physical Therapy

## 2018-09-18 ENCOUNTER — Ambulatory Visit: Payer: Managed Care, Other (non HMO) | Admitting: Speech Pathology

## 2018-09-21 ENCOUNTER — Ambulatory Visit: Payer: Managed Care, Other (non HMO) | Admitting: Speech Pathology

## 2018-09-21 ENCOUNTER — Ambulatory Visit: Payer: Managed Care, Other (non HMO) | Admitting: Physical Therapy

## 2018-09-21 ENCOUNTER — Encounter: Payer: Managed Care, Other (non HMO) | Admitting: Occupational Therapy

## 2018-09-23 ENCOUNTER — Ambulatory Visit: Payer: Managed Care, Other (non HMO)

## 2018-09-23 ENCOUNTER — Ambulatory Visit: Payer: Managed Care, Other (non HMO) | Admitting: Physical Therapy

## 2018-09-23 ENCOUNTER — Encounter: Payer: Managed Care, Other (non HMO) | Admitting: Occupational Therapy

## 2018-09-28 ENCOUNTER — Encounter: Payer: Managed Care, Other (non HMO) | Admitting: Occupational Therapy

## 2018-09-28 ENCOUNTER — Ambulatory Visit: Payer: Managed Care, Other (non HMO) | Admitting: Physical Therapy

## 2018-09-30 ENCOUNTER — Ambulatory Visit: Payer: Managed Care, Other (non HMO) | Admitting: Physical Therapy

## 2018-09-30 ENCOUNTER — Encounter: Payer: Managed Care, Other (non HMO) | Admitting: Occupational Therapy

## 2018-10-05 ENCOUNTER — Encounter: Payer: Managed Care, Other (non HMO) | Admitting: Speech Pathology

## 2018-10-05 ENCOUNTER — Ambulatory Visit: Payer: Managed Care, Other (non HMO) | Admitting: Physical Therapy

## 2018-10-07 ENCOUNTER — Ambulatory Visit: Payer: Managed Care, Other (non HMO) | Admitting: Physical Therapy

## 2018-10-07 ENCOUNTER — Encounter: Payer: Managed Care, Other (non HMO) | Admitting: Occupational Therapy

## 2018-10-12 ENCOUNTER — Encounter: Payer: Managed Care, Other (non HMO) | Admitting: Speech Pathology

## 2018-10-12 ENCOUNTER — Encounter: Payer: Managed Care, Other (non HMO) | Admitting: Occupational Therapy

## 2018-10-15 ENCOUNTER — Encounter: Payer: Managed Care, Other (non HMO) | Admitting: Speech Pathology

## 2018-10-15 ENCOUNTER — Encounter: Payer: Managed Care, Other (non HMO) | Admitting: Occupational Therapy

## 2018-10-19 ENCOUNTER — Encounter: Payer: Managed Care, Other (non HMO) | Admitting: Occupational Therapy

## 2018-10-19 ENCOUNTER — Encounter: Payer: Managed Care, Other (non HMO) | Admitting: Speech Pathology

## 2018-10-21 ENCOUNTER — Telehealth: Payer: Self-pay | Admitting: *Deleted

## 2018-10-21 ENCOUNTER — Encounter: Payer: Self-pay | Admitting: *Deleted

## 2018-10-21 NOTE — Addendum Note (Signed)
Addended by: [place] Pink on: 10/21/2018 09:35 AM   Modules accepted: Orders

## 2018-10-21 NOTE — Telephone Encounter (Signed)
LVM requesting call back re: my chart messages exchanged today- to discuss due to current COVID 19 pandemic, our office is severely reducing in person visits in order to minimize the risk to our patients and healthcare providers. We recommend to convert your appointment to a video visit. We'll take all precautions to reduce any security or privacy concerns. This will be treated like an office visit, and we will file with your insurance. Advised we need to reschedule her sooner than June as a hospital stroke follow up.

## 2018-10-21 NOTE — Telephone Encounter (Signed)
Patient returned call, reviewed with her that due to current COVID 19 pandemic, our office is severely reducing in person visits in order to minimize the risk to our patients and healthcare providers. We recommend to convert your appointment to a video visit. We'll take all precautions to reduce any security or privacy concerns. This will be treated like an office visit, and we will file with your insurance. She consented to video visit. Pt's email is [email or web address].  Pt understands that the [employer] webex software must be downloaded and operational on the device pt plans to use for the visit. Updated EMR. She had no questions,  verbalized understanding, appreciation. Webex scheduled; e mail sent.

## 2018-10-22 ENCOUNTER — Ambulatory Visit (INDEPENDENT_AMBULATORY_CARE_PROVIDER_SITE_OTHER): Payer: Self-pay | Admitting: Diagnostic Neuroimaging

## 2018-10-22 ENCOUNTER — Encounter: Payer: Managed Care, Other (non HMO) | Admitting: Speech Pathology

## 2018-10-22 ENCOUNTER — Encounter: Payer: Managed Care, Other (non HMO) | Admitting: Occupational Therapy

## 2018-10-22 ENCOUNTER — Encounter: Payer: Self-pay | Admitting: Diagnostic Neuroimaging

## 2018-10-22 ENCOUNTER — Other Ambulatory Visit: Payer: Self-pay

## 2018-10-22 DIAGNOSIS — I63312 Cerebral infarction due to thrombosis of left middle cerebral artery: Secondary | ICD-10-CM

## 2018-10-22 MED ORDER — ATORVASTATIN CALCIUM 40 MG PO TABS: 40.0000 mg | ORAL_TABLET | Freq: Every day | ORAL | 1 refills | Status: DC

## 2018-10-22 MED ORDER — LISINOPRIL 10 MG PO TABS: 10.0000 mg | ORAL_TABLET | Freq: Every day | ORAL | 1 refills | Status: DC

## 2018-10-22 MED ORDER — ASPIRIN 81 MG PO TABS: 81.0000 mg | ORAL_TABLET | Freq: Every day | ORAL | Status: DC
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
## 2018-10-26 ENCOUNTER — Encounter: Payer: Managed Care, Other (non HMO) | Admitting: Speech Pathology

## 2018-10-29 ENCOUNTER — Encounter: Payer: Managed Care, Other (non HMO) | Admitting: Speech Pathology

## 2018-11-02 ENCOUNTER — Encounter: Payer: Managed Care, Other (non HMO) | Admitting: Speech Pathology

## 2018-11-05 ENCOUNTER — Encounter: Payer: Managed Care, Other (non HMO) | Admitting: Speech Pathology

## 2018-12-07 ENCOUNTER — Ambulatory Visit: Payer: Managed Care, Other (non HMO) | Admitting: Diagnostic Neuroimaging

## 2019-02-10 ENCOUNTER — Other Ambulatory Visit: Payer: Self-pay

## 2019-02-10 ENCOUNTER — Ambulatory Visit: Payer: Self-pay

## 2019-02-10 NOTE — Progress Notes (Unsigned)
Reviewed pharmacy and medications and signed patient up for [facility].  Patient in need of heart medications added to [facility] schedule at [facility] 02/10/19.

## 2019-02-11 ENCOUNTER — Encounter: Payer: Self-pay | Admitting: Gerontology

## 2019-02-11 ENCOUNTER — Ambulatory Visit: Payer: Self-pay | Admitting: Gerontology

## 2019-02-11 VITALS — BP 156/105 | HR 64 | Temp 97.3°F | Ht 70.0 in | Wt 204.0 lb

## 2019-02-11 DIAGNOSIS — E890 Postprocedural hypothyroidism: Secondary | ICD-10-CM

## 2019-02-11 DIAGNOSIS — I6350 Cerebral infarction due to unspecified occlusion or stenosis of unspecified cerebral artery: Secondary | ICD-10-CM

## 2019-02-11 DIAGNOSIS — Z7689 Persons encountering health services in other specified circumstances: Secondary | ICD-10-CM | POA: Insufficient documentation

## 2019-02-11 DIAGNOSIS — E785 Hyperlipidemia, unspecified: Secondary | ICD-10-CM

## 2019-02-11 DIAGNOSIS — I10 Essential (primary) hypertension: Secondary | ICD-10-CM

## 2019-02-11 DIAGNOSIS — Z789 Other specified health status: Secondary | ICD-10-CM | POA: Insufficient documentation

## 2019-02-11 MED ORDER — LEVOTHYROXINE SODIUM 175 MCG PO TABS: 175.0000 ug | ORAL_TABLET | Freq: Every day | ORAL | 1 refills | Status: DC

## 2019-02-11 MED ORDER — ATORVASTATIN CALCIUM 40 MG PO TABS: 40.0000 mg | ORAL_TABLET | Freq: Every day | ORAL | 1 refills | Status: DC

## 2019-02-11 MED ORDER — LISINOPRIL 10 MG PO TABS: 10.0000 mg | ORAL_TABLET | Freq: Every day | ORAL | 1 refills | Status: DC

## 2019-02-11 MED ORDER — CLOPIDOGREL BISULFATE 75 MG PO TABS: 75.0000 mg | ORAL_TABLET | Freq: Every day | ORAL | 2 refills | Status: DC

## 2019-02-11 NOTE — Patient Instructions (Signed)

## 2019-02-11 NOTE — Progress Notes (Signed)
Patient ID: Laurie Valenzuela, female   DOB: Jun 02, 1956, 64 y.o.   MRN: [id number]  Chief Complaint  Patient presents with  . New Patient (Initial Visit)    establish care    HPI Laurie Valenzuela is a 63 y.o. female who presents to establish care and evaluation of her chronic problems. She has a history of hypertension, hyperlipidemia, hypothyroidism and CVA. She was evaluated on 08/28/28 for CVA and also for acute oblique fractures of the distal tibia and proximal fibula of her right leg on 09/01/18. She has no residual effect from her CVA, but limps on her right leg,  and ambulates with a four prong cane. She states that she didn't have insurance to follow up with Neurologist. She is compliant with her medications, though she takes her lisinopril every other day because she has no refills. She also has not had mammogram, pap smear or colonoscopy. She denies chest pain, palpitation, myalgia, fatigue, cold or heat intolerance, constipation, bruises, hematuria, hematochezia, fever, chills and no further concerns.   Past Medical History:  Diagnosis Date  . HTN (hypertension)   . Hyperlipidemia   . Hypothyroidism   . Stroke [facility])    2017, 08/2018    Past Surgical History:  Procedure Laterality Date  . CESAREAN SECTION    . TIBIA IM NAIL INSERTION Right 09/03/2018   Procedure: INTRAMEDULLARY (IM) NAIL TIBIAL;  Surgeon: Altamese Morristown, MD;  Location: [facility];  Service: Orthopedics;  Laterality: Right;    Family History  Problem Relation Age of Onset  . Depression Father     Social History Social History   Tobacco Use  . Smoking status: Former Smoker    Packs/day: 1.00    Years: 40.00    Pack years: 40.00  . Smokeless tobacco: Never Used  Substance Use Topics  . Alcohol use: Yes    Alcohol/week: 2.0 standard drinks    Types: 2 Standard drinks or equivalent per week  . Drug use: Never    Allergies  Allergen Reactions  . Codeine     Current Outpatient  Medications  Medication Sig Dispense Refill  . aspirin 81 MG tablet Take 1 tablet (81 mg total) by mouth daily. 30 tablet   . atorvastatin (LIPITOR) 40 MG tablet Take 1 tablet (40 mg total) by mouth daily at 6 PM. 30 tablet 1  . clopidogrel (PLAVIX) 75 MG tablet Take 1 tablet (75 mg total) by mouth daily. 30 tablet 2  . levothyroxine (SYNTHROID) 175 MCG tablet Take 1 tablet (175 mcg total) by mouth daily. 30 tablet 1  . lisinopril (ZESTRIL) 10 MG tablet Take 1 tablet (10 mg total) by mouth daily. 30 tablet 1  . Multiple Vitamins-Minerals (MULTIVITAMIN WITH MINERALS) tablet Take 1 tablet by mouth daily.    . vitamin C (VITAMIN C) 500 MG tablet Take 1 tablet (500 mg total) by mouth daily. (Patient not taking: Reported on 02/11/2019) 60 tablet 0   No current facility-administered medications for this visit.     Review of Systems Review of Systems  Constitutional: Negative.   HENT: Negative.   Eyes: Negative.   Respiratory: Negative.   Cardiovascular: Negative.   Gastrointestinal: Negative.   Endocrine: Negative.   Genitourinary: Negative.   Musculoskeletal: Negative.   Skin: Negative.   Neurological: Negative.   Hematological: Negative.   Psychiatric/Behavioral: Negative.     Blood pressure (!) 156/105, pulse 64, temperature (!) 97.3 F (36.3 C), height 5' 10"  (1.778 m), weight 204 lb (92.5 kg).  Physical Exam Physical Exam Constitutional:      Appearance: Normal appearance.  HENT:     Head: Normocephalic and atraumatic.     Nose: Nose normal.     Mouth/Throat:     Mouth: Mucous membranes are moist.  Eyes:     Extraocular Movements: Extraocular movements intact.     Pupils: Pupils are equal, round, and reactive to light.  Neck:     Musculoskeletal: Normal range of motion.  Cardiovascular:     Rate and Rhythm: Normal rate and regular rhythm.     Pulses: Normal pulses.     Heart sounds: Normal heart sounds.  Pulmonary:     Effort: Pulmonary effort is normal.     Breath  sounds: Normal breath sounds.  Abdominal:     General: Bowel sounds are normal.     Palpations: Abdomen is soft.  Musculoskeletal:        General: Deformity (limping on right leg) present.  Skin:    General: Skin is warm.  Neurological:     General: No focal deficit present.     Mental Status: She is alert and oriented to person, place, and time.  Psychiatric:        Mood and Affect: Mood normal.        Behavior: Behavior normal.        Thought Content: Thought content normal.        Judgment: Judgment normal.     Data Reviewed Labs and past medical record was reviewed.  Assessment and Plan 1. Essential (primary) hypertension - She will continue on her current treatment regimen. -Low salt DASH diet -Take medications regularly on time -Exercise regularly as tolerated -Check blood pressure daily, record and bring log to office clinic -Goal is less than 140/90 and normal blood pressure is 120/80 - lisinopril (ZESTRIL) 10 MG tablet; Take 1 tablet (10 mg total) by mouth daily.  Dispense: 30 tablet; Refill: 1 - Comp Met (CMET); Future  2. Cerebrovascular accident (CVA) due to occlusion of cerebral artery ([place]) - She will continue on current treatment regimen. - clopidogrel (PLAVIX) 75 MG tablet; Take 1 tablet (75 mg total) by mouth daily.  Dispense: 30 tablet; Refill: 2 - Ambulatory referral to Neurology  3. Hyperlipidemia, unspecified hyperlipidemia type - She will continue on current treatment regimen. -Low fat Diet, like low fat dairy products eg skimmed milk -Avoid any fried food -Regular exercise/walk -Goal for Total Cholesterol is less than 200 -Goal for bad cholesterol LDL is less than 100 -Goal for Good cholesterol HDL is more than 45 -Goal for Triglyceride is less than 150 - atorvastatin (LIPITOR) 40 MG tablet; Take 1 tablet (40 mg total) by mouth daily at 6 PM.  Dispense: 30 tablet; Refill: 1 - Lipid panel; Future  4. Encounter to establish care - Comp Met  (CMET); Future - Urinalysis; Future - Ambulatory referral to Gastroenterology - MM Digital Screening; Future - Cytology - PAP( [place])  5. Needs assistance with community resources - She will follow up with Ms. Simpson  6. Hypothyroidism, postablative - She will continue on current treatment regimen. - levothyroxine (SYNTHROID) 175 MCG tablet; Take 1 tablet (175 mcg total) by mouth daily.  Dispense: 30 tablet; Refill: 1 - TSH; Future   Follow up ( 03/04/19) or if symptoms worsens.  Filicia Scogin E Noorah Giammona 02/13/2019, 8:31 PM

## 2019-02-18 ENCOUNTER — Institutional Professional Consult (permissible substitution): Payer: Self-pay | Admitting: Licensed Clinical Social Worker

## 2019-02-18 ENCOUNTER — Encounter: Payer: Self-pay | Admitting: Gastroenterology

## 2019-02-24 ENCOUNTER — Other Ambulatory Visit: Payer: Self-pay

## 2019-02-24 DIAGNOSIS — Z7689 Persons encountering health services in other specified circumstances: Secondary | ICD-10-CM

## 2019-02-24 DIAGNOSIS — E890 Postprocedural hypothyroidism: Secondary | ICD-10-CM

## 2019-02-24 DIAGNOSIS — I10 Essential (primary) hypertension: Secondary | ICD-10-CM

## 2019-02-24 DIAGNOSIS — E785 Hyperlipidemia, unspecified: Secondary | ICD-10-CM

## 2019-02-25 ENCOUNTER — Ambulatory Visit: Payer: Self-pay | Admitting: Licensed Clinical Social Worker

## 2019-02-25 DIAGNOSIS — Z789 Other specified health status: Secondary | ICD-10-CM

## 2019-02-25 LAB — COMPREHENSIVE METABOLIC PANEL
ALT: 16 IU/L (ref 0–32)
AST: 15 IU/L (ref 0–40)
Albumin/Globulin Ratio: 1.7 (ref 1.2–2.2)
Albumin: 4.6 g/dL (ref 3.8–4.8)
Alkaline Phosphatase: 106 IU/L (ref 39–117)
BUN/Creatinine Ratio: 20 (ref 12–28)
BUN: 16 mg/dL (ref 8–27)
Bilirubin Total: 0.3 mg/dL (ref 0.0–1.2)
CO2: 23 mmol/L (ref 20–29)
Calcium: 9.5 mg/dL (ref 8.7–10.3)
Chloride: 101 mmol/L (ref 96–106)
Creatinine, Ser: 0.79 mg/dL (ref 0.57–1.00)
GFR calc Af Amer: 92 mL/min/{1.73_m2} (ref >59)
GFR calc non Af Amer: 80 mL/min/{1.73_m2} (ref >59)
Globulin, Total: 2.7 g/dL (ref 1.5–4.5)
Glucose: 92 mg/dL (ref 65–99)
Potassium: 4.2 mmol/L (ref 3.5–5.2)
Sodium: 142 mmol/L (ref 134–144)
Total Protein: 7.3 g/dL (ref 6.0–8.5)

## 2019-02-25 LAB — URINALYSIS
Bilirubin, UA: NEGATIVE
Glucose, UA: NEGATIVE
Ketones, UA: NEGATIVE
Leukocytes,UA: NEGATIVE
Nitrite, UA: NEGATIVE
RBC, UA: NEGATIVE
Specific Gravity, UA: 1.029 (ref 1.005–1.030)
Urobilinogen, Ur: 0.2 mg/dL (ref 0.2–1.0)
pH, UA: 5 (ref 5.0–7.5)

## 2019-02-25 LAB — TSH: TSH: 22.8 u[IU]/mL — ABNORMAL HIGH (ref 0.450–4.500)

## 2019-02-25 LAB — LIPID PANEL
Chol/HDL Ratio: 3.4 ratio (ref 0.0–4.4)
Cholesterol, Total: 156 mg/dL (ref 100–199)
HDL: 46 mg/dL (ref >39)
LDL Calculated: 89 mg/dL (ref 0–99)
Triglycerides: 103 mg/dL (ref 0–149)
VLDL Cholesterol Cal: 21 mg/dL (ref 5–40)

## 2019-02-25 NOTE — BH Specialist Note (Signed)
Clinician completed an [place] [employer] screening for the patient. She explained that she would send the patient a disability checklist for all the documents she will need when she fills out the application. She suggested that she find a disability attorney in [place]. She explained that she would also mail the charity care application to cover the cost of the hospital bills and high light what documents are needed to accompany the application. She instructed the patient to turn in the charity care application along with the documents to [facility] to review and submit on her behalf so it can be tracked because if you are turned down, it will be six months before you can apply again.   Clinician explained that they partner with [facility] that has a Pharmacist, community. She explained that you fill out the application and pay a 16.10 co pay to the clinic to cover the first two visits. She explained that once the co pay is received along with the application, that [facility] would reach out to the patient to schedule an appointment or that she can contact [facility] directly.   Patient stated she needed assistance with applying for disability, a dentist, and how to pay for her hospital bills. She asked if the clinician would please mail all paperwork to [address] [place] [address].

## 2019-03-04 ENCOUNTER — Other Ambulatory Visit: Payer: Self-pay

## 2019-03-04 ENCOUNTER — Ambulatory Visit: Payer: Self-pay | Admitting: Gerontology

## 2019-03-04 ENCOUNTER — Encounter: Payer: Self-pay | Admitting: Gerontology

## 2019-03-04 VITALS — BP 112/78 | HR 62 | Temp 97.2°F | Ht 70.0 in | Wt 203.0 lb

## 2019-03-04 DIAGNOSIS — E785 Hyperlipidemia, unspecified: Secondary | ICD-10-CM

## 2019-03-04 DIAGNOSIS — I6350 Cerebral infarction due to unspecified occlusion or stenosis of unspecified cerebral artery: Secondary | ICD-10-CM

## 2019-03-04 DIAGNOSIS — E890 Postprocedural hypothyroidism: Secondary | ICD-10-CM

## 2019-03-04 DIAGNOSIS — Z789 Other specified health status: Secondary | ICD-10-CM

## 2019-03-04 DIAGNOSIS — I10 Essential (primary) hypertension: Secondary | ICD-10-CM

## 2019-03-04 MED ORDER — LISINOPRIL 10 MG PO TABS: 10.0000 mg | ORAL_TABLET | Freq: Every day | ORAL | 1 refills | Status: DC

## 2019-03-04 MED ORDER — ATORVASTATIN CALCIUM 40 MG PO TABS: 40.0000 mg | ORAL_TABLET | Freq: Every day | ORAL | 1 refills | Status: DC

## 2019-03-04 MED ORDER — LEVOTHYROXINE SODIUM 175 MCG PO TABS: 175.0000 ug | ORAL_TABLET | Freq: Every day | ORAL | 1 refills | Status: DC

## 2019-03-04 NOTE — Progress Notes (Signed)
Established Patient Office Visit  Subjective:  Patient ID: Laurie Valenzuela, female    DOB: 11-Nov-1955  Age: 63 y.o. MRN: [id number]  CC:  Chief Complaint  Patient presents with  . Hypertension  . Hyperlipidemia  . Hypothyroidism    HPI [name] presents for follow up of hypertension, hyperlipidemia and hypothyroidism. She's compliant with her medications and she reports that she's doing well. She checks her blood pressure at home and she states that it's less than 130/90. Her TSH done 8 days ago was 22.800, she reports taking 175 mcg of levothyroxine with all her medications. She denies fatigue,constipation, cold or heat intolerance. She states that she will call and schedule appointment with Neurology. She requests a follow up with [name] with regards to her Disability and Medicaid applications. She continues to limp, but has stable gait, denies chest pain, palpitation, motor weakness, fever, chills and no further concerns.   Past Medical History:  Diagnosis Date  . HTN (hypertension)   . Hyperlipidemia   . Hypothyroidism   . Stroke [facility])    2017, 08/2018    Past Surgical History:  Procedure Laterality Date  . CESAREAN SECTION    . TIBIA IM NAIL INSERTION Right 09/03/2018   Procedure: INTRAMEDULLARY (IM) NAIL TIBIAL;  Surgeon: Myrene Galas, MD;  Location: [facility] OR;  Service: Orthopedics;  Laterality: Right;    Family History  Problem Relation Age of Onset  . Depression Father     Social History   Socioeconomic History  . Marital status: Single    Spouse name: Not on file  . Number of children: Not on file  . Years of education: Not on file  . Highest education level: Bachelor's degree (e.g., BA, AB, BS)  Occupational History    Comment: na  Social Needs  . Financial resource strain: Not on file  . Food insecurity    Worry: Not on file    Inability: Not on file  . Transportation needs    Medical: Not on file    Non-medical:  Not on file  Tobacco Use  . Smoking status: Former Smoker    Packs/day: 1.00    Years: 40.00    Pack years: 40.00  . Smokeless tobacco: Never Used  Substance and Sexual Activity  . Alcohol use: Yes    Alcohol/week: 2.0 standard drinks    Types: 2 Standard drinks or equivalent per week  . Drug use: Never  . Sexual activity: Not on file  Lifestyle  . Physical activity    Days per week: Not on file    Minutes per session: Not on file  . Stress: Not on file  Relationships  . Social Musician on phone: Not on file    Gets together: Not on file    Attends religious service: Not on file    Active member of club or organization: Not on file    Attends meetings of clubs or organizations: Not on file    Relationship status: Not on file  . Intimate partner violence    Fear of current or ex partner: Not on file    Emotionally abused: Not on file    Physically abused: Not on file    Forced sexual activity: Not on file  Other Topics Concern  . Not on file  Social History Narrative   Lives alone   Caffeine 1-2 day    Outpatient Medications Prior to Visit  Medication Sig Dispense Refill  .  clopidogrel (PLAVIX) 75 MG tablet Take 1 tablet (75 mg total) by mouth daily. 30 tablet 2  . Multiple Vitamins-Minerals (MULTIVITAMIN WITH MINERALS) tablet Take 1 tablet by mouth daily.    Marland Kitchen. aspirin 81 MG tablet Take 1 tablet (81 mg total) by mouth daily. 30 tablet   . atorvastatin (LIPITOR) 40 MG tablet Take 1 tablet (40 mg total) by mouth daily at 6 PM. 30 tablet 1  . levothyroxine (SYNTHROID) 175 MCG tablet Take 1 tablet (175 mcg total) by mouth daily. 30 tablet 1  . lisinopril (ZESTRIL) 10 MG tablet Take 1 tablet (10 mg total) by mouth daily. 30 tablet 1  . vitamin C (VITAMIN C) 500 MG tablet Take 1 tablet (500 mg total) by mouth daily. (Patient not taking: Reported on 02/11/2019) 60 tablet 0   No facility-administered medications prior to visit.     Allergies  Allergen Reactions   . Codeine     ROS Review of Systems  Constitutional: Negative.   HENT: Negative.   Eyes: Negative.   Respiratory: Negative.   Cardiovascular: Negative.   Endocrine: Negative.   Genitourinary: Negative.   Musculoskeletal: Negative.   Skin: Negative.   Neurological: Negative.   Hematological: Negative.   Psychiatric/Behavioral: Negative.       Objective:    Physical Exam  Constitutional: She is oriented to person, place, and time. She appears well-developed.  HENT:  Head: Normocephalic and atraumatic.  Eyes: Pupils are equal, round, and reactive to light. EOM are normal.  Neck: Normal range of motion.  Cardiovascular: Normal rate and regular rhythm.  Pulmonary/Chest: Effort normal and breath sounds normal.  Abdominal: Soft. Bowel sounds are normal.  Musculoskeletal: Normal range of motion.     Comments: Limp gait to right lower leg.  Neurological: She is alert and oriented to person, place, and time. She has normal reflexes.  Skin: Skin is warm and dry.  Psychiatric: She has a normal mood and affect. Her behavior is normal. Judgment and thought content normal.    BP 112/78 (BP Location: Right Arm, Patient Position: Sitting)   Pulse 62   Temp (!) 97.2 F (36.2 C)   Ht 5\' 10"  (1.778 m)   Wt 203 lb (92.1 kg)   BMI 29.13 kg/m  Wt Readings from Last 3 Encounters:  03/04/19 203 lb (92.1 kg)  02/11/19 204 lb (92.5 kg)  09/01/18 224 lb 13.9 oz (102 kg)   She was encouraged to continue weight loss regimen.  Health Maintenance Due  Topic Date Due  . Hepatitis C Screening  March 28, 1956  . TETANUS/TDAP  03/29/1974  . PAP SMEAR-Modifier  03/29/1976  . MAMMOGRAM  03/29/2005  . COLONOSCOPY  03/29/2005  . INFLUENZA VACCINE  01/30/2019    There are no preventive care reminders to display for this patient.  Lab Results  Component Value Date   TSH 22.800 (H) 02/24/2019   Lab Results  Component Value Date   WBC 10.2 09/05/2018   HGB 11.2 (L) 09/05/2018   HCT 34.1  (L) 09/05/2018   MCV 95.3 09/05/2018   PLT 230 09/05/2018   Lab Results  Component Value Date   NA 142 02/24/2019   K 4.2 02/24/2019   CO2 23 02/24/2019   GLUCOSE 92 02/24/2019   BUN 16 02/24/2019   CREATININE 0.79 02/24/2019   BILITOT 0.3 02/24/2019   ALKPHOS 106 02/24/2019   AST 15 02/24/2019   ALT 16 02/24/2019   PROT 7.3 02/24/2019   ALBUMIN 4.6 02/24/2019   CALCIUM  9.5 02/24/2019   ANIONGAP 10 09/05/2018   Lab Results  Component Value Date   CHOL 156 02/24/2019   Lab Results  Component Value Date   HDL 46 02/24/2019   Lab Results  Component Value Date   LDLCALC 89 02/24/2019   Lab Results  Component Value Date   TRIG 103 02/24/2019   Lab Results  Component Value Date   CHOLHDL 3.4 02/24/2019   Lab Results  Component Value Date   HGBA1C 5.4 08/28/2018      Assessment & Plan:     1. Essential (primary) hypertension - She will continue on current treatment regimen. -Low salt DASH diet -Take medications regularly on time -Exercise regularly as tolerated -Check blood pressure at least once a week at home, record and bring log to clinic. -Goal is less than 140/90 and normal blood pressure is 120/80 - lisinopril (ZESTRIL) 10 MG tablet; Take 1 tablet (10 mg total) by mouth daily.  Dispense: 30 tablet; Refill: 1  2. Hypothyroidism, postablative - Her TSH was 22.800, and she was taking her Levothyroxine with other medications. Her dose will not be increased, she was advised to take her Levothyroxine 60 minutes in an empty stomach, before breakfast and not to take with any medications. - levothyroxine (SYNTHROID) 175 MCG tablet; Take 1 tablet (175 mcg total) by mouth daily.  Dispense: 30 tablet; Refill: 1 - TSH; will be rechecked on 04/21/19.  3. Hyperlipidemia, unspecified hyperlipidemia type - She will continue on current treatment regimen. -Low fat Diet, like low fat dairy products eg skimmed milk -Avoid any fried food -Regular exercise/walk -Goal  for Total Cholesterol is less than 200 -Goal for bad cholesterol LDL is less than 70 -Goal for Good cholesterol HDL is more than 45 -Goal for Triglyceride is less than 150 - atorvastatin (LIPITOR) 40 MG tablet; Take 1 tablet (40 mg total) by mouth daily at 6 PM.  Dispense: 30 tablet; Refill: 1  4. Cerebrovascular accident (CVA) due to occlusion of cerebral artery ([place]) - She was advised to call and schedule a follow up appointment with Neurology and was encouraged to complete and submit charity care application.  5. Needs assistance with community resources - She will follow up with Ms. Simpson H   Follow-up: Return in about 8 weeks (around 04/29/2019), or if symptoms worsen or fail to improve.    Kameah Rawl Jerold Coombe, NP

## 2019-03-04 NOTE — Patient Instructions (Signed)
DASH Eating Plan DASH stands for "Dietary Approaches to Stop Hypertension." The DASH eating plan is a healthy eating plan that has been shown to reduce high blood pressure (hypertension). It may also reduce your risk for type 2 diabetes, heart disease, and stroke. The DASH eating plan may also help with weight loss. What are tips for following this plan?  General guidelines  Avoid eating more than 2,300 mg (milligrams) of salt (sodium) a day. If you have hypertension, you may need to reduce your sodium intake to 1,500 mg a day.  Limit alcohol intake to no more than 1 drink a day for nonpregnant women and 2 drinks a day for men. One drink equals 12 oz of beer, 5 oz of wine, or 1 oz of hard liquor.  Work with your health care provider to maintain a healthy body weight or to lose weight. Ask what an ideal weight is for you.  Get at least 30 minutes of exercise that causes your heart to beat faster (aerobic exercise) most days of the week. Activities may include walking, swimming, or biking.  Work with your health care provider or diet and nutrition specialist (dietitian) to adjust your eating plan to your individual calorie needs. Reading food labels   Check food labels for the amount of sodium per serving. Choose foods with less than 5 percent of the Daily Value of sodium. Generally, foods with less than 300 mg of sodium per serving fit into this eating plan.  To find whole grains, look for the word "whole" as the first word in the ingredient list. Shopping  Buy products labeled as "low-sodium" or "no salt added."  Buy fresh foods. Avoid canned foods and premade or frozen meals. Cooking  Avoid adding salt when cooking. Use salt-free seasonings or herbs instead of table salt or sea salt. Check with your health care provider or pharmacist before using salt substitutes.  Do not fry foods. Cook foods using healthy methods such as baking, boiling, grilling, and broiling instead.  Cook with  heart-healthy oils, such as olive, canola, soybean, or sunflower oil. Meal planning  Eat a balanced diet that includes: ? 5 or more servings of fruits and vegetables each day. At each meal, try to fill half of your plate with fruits and vegetables. ? Up to 6-8 servings of whole grains each day. ? Less than 6 oz of lean meat, poultry, or fish each day. A 3-oz serving of meat is about the same size as a deck of cards. One egg equals 1 oz. ? 2 servings of low-fat dairy each day. ? A serving of nuts, seeds, or beans 5 times each week. ? Heart-healthy fats. Healthy fats called Omega-3 fatty acids are found in foods such as flaxseeds and coldwater fish, like sardines, salmon, and mackerel.  Limit how much you eat of the following: ? Canned or prepackaged foods. ? Food that is high in trans fat, such as fried foods. ? Food that is high in saturated fat, such as fatty meat. ? Sweets, desserts, sugary drinks, and other foods with added sugar. ? Full-fat dairy products.  Do not salt foods before eating.  Try to eat at least 2 vegetarian meals each week.  Eat more home-cooked food and less restaurant, buffet, and fast food.  When eating at a restaurant, ask that your food be prepared with less salt or no salt, if possible. What foods are recommended? The items listed may not be a complete list. Talk with your dietitian about   what dietary choices are best for you. Grains Whole-grain or whole-wheat bread. Whole-grain or whole-wheat pasta. Brown rice. Oatmeal. Quinoa. Bulgur. Whole-grain and low-sodium cereals. Pita bread. Low-fat, low-sodium crackers. Whole-wheat flour tortillas. Vegetables Fresh or frozen vegetables (raw, steamed, roasted, or grilled). Low-sodium or reduced-sodium tomato and vegetable juice. Low-sodium or reduced-sodium tomato sauce and tomato paste. Low-sodium or reduced-sodium canned vegetables. Fruits All fresh, dried, or frozen fruit. Canned fruit in natural juice (without  added sugar). Meat and other protein foods Skinless chicken or turkey. Ground chicken or turkey. Pork with fat trimmed off. Fish and seafood. Egg whites. Dried beans, peas, or lentils. Unsalted nuts, nut butters, and seeds. Unsalted canned beans. Lean cuts of beef with fat trimmed off. Low-sodium, lean deli meat. Dairy Low-fat (1%) or fat-free (skim) milk. Fat-free, low-fat, or reduced-fat cheeses. Nonfat, low-sodium ricotta or cottage cheese. Low-fat or nonfat yogurt. Low-fat, low-sodium cheese. Fats and oils Soft margarine without trans fats. Vegetable oil. Low-fat, reduced-fat, or light mayonnaise and salad dressings (reduced-sodium). Canola, safflower, olive, soybean, and sunflower oils. Avocado. Seasoning and other foods Herbs. Spices. Seasoning mixes without salt. Unsalted popcorn and pretzels. Fat-free sweets. What foods are not recommended? The items listed may not be a complete list. Talk with your dietitian about what dietary choices are best for you. Grains Baked goods made with fat, such as croissants, muffins, or some breads. Dry pasta or rice meal packs. Vegetables Creamed or fried vegetables. Vegetables in a cheese sauce. Regular canned vegetables (not low-sodium or reduced-sodium). Regular canned tomato sauce and paste (not low-sodium or reduced-sodium). Regular tomato and vegetable juice (not low-sodium or reduced-sodium). Pickles. Olives. Fruits Canned fruit in a light or heavy syrup. Fried fruit. Fruit in cream or butter sauce. Meat and other protein foods Fatty cuts of meat. Ribs. Fried meat. Bacon. Sausage. Bologna and other processed lunch meats. Salami. Fatback. Hotdogs. Bratwurst. Salted nuts and seeds. Canned beans with added salt. Canned or smoked fish. Whole eggs or egg yolks. Chicken or turkey with skin. Dairy Whole or 2% milk, cream, and half-and-half. Whole or full-fat cream cheese. Whole-fat or sweetened yogurt. Full-fat cheese. Nondairy creamers. Whipped toppings.  Processed cheese and cheese spreads. Fats and oils Butter. Stick margarine. Lard. Shortening. Ghee. Bacon fat. Tropical oils, such as coconut, palm kernel, or palm oil. Seasoning and other foods Salted popcorn and pretzels. Onion salt, garlic salt, seasoned salt, table salt, and sea salt. Worcestershire sauce. Tartar sauce. Barbecue sauce. Teriyaki sauce. Soy sauce, including reduced-sodium. Steak sauce. Canned and packaged gravies. Fish sauce. Oyster sauce. Cocktail sauce. Horseradish that you find on the shelf. Ketchup. Mustard. Meat flavorings and tenderizers. Bouillon cubes. Hot sauce and Tabasco sauce. Premade or packaged marinades. Premade or packaged taco seasonings. Relishes. Regular salad dressings. Where to find more information:  National Heart, Lung, and Blood Institute: www.nhlbi.nih.gov  American Heart Association: www.heart.org Summary  The DASH eating plan is a healthy eating plan that has been shown to reduce high blood pressure (hypertension). It may also reduce your risk for type 2 diabetes, heart disease, and stroke.  With the DASH eating plan, you should limit salt (sodium) intake to 2,300 mg a day. If you have hypertension, you may need to reduce your sodium intake to 1,500 mg a day.  When on the DASH eating plan, aim to eat more fresh fruits and vegetables, whole grains, lean proteins, low-fat dairy, and heart-healthy fats.  Work with your health care provider or diet and nutrition specialist (dietitian) to adjust your eating plan to your   individual calorie needs. This information is not intended to replace advice given to you by your health care provider. Make sure you discuss any questions you have with your health care provider. Document Released: 06/06/2011 Document Revised: 05/30/2017 Document Reviewed: 06/10/2016 Elsevier Patient Education  2020 Elsevier Inc.   Fat and Cholesterol Restricted Eating Plan Getting too much fat and cholesterol in your diet may cause  health problems. Choosing the right foods helps keep your fat and cholesterol at normal levels. This can keep you from getting certain diseases. Your doctor may recommend an eating plan that includes:  Total fat: ______% or less of total calories a day.  Saturated fat: ______% or less of total calories a day.  Cholesterol: less than _________mg a day.  Fiber: ______g a day. What are tips for following this plan? Meal planning  At meals, divide your plate into four equal parts: ? Fill one-half of your plate with vegetables and green salads. ? Fill one-fourth of your plate with whole grains. ? Fill one-fourth of your plate with low-fat (lean) protein foods.  Eat fish that is high in omega-3 fats at least two times a week. This includes mackerel, tuna, sardines, and salmon.  Eat foods that are high in fiber, such as whole grains, beans, apples, broccoli, carrots, peas, and barley. General tips   Work with your doctor to lose weight if you need to.  Avoid: ? Foods with added sugar. ? Fried foods. ? Foods with partially hydrogenated oils.  Limit alcohol intake to no more than 1 drink a day for nonpregnant women and 2 drinks a day for men. One drink equals 12 oz of beer, 5 oz of wine, or 1 oz of hard liquor. Reading food labels  Check food labels for: ? Trans fats. ? Partially hydrogenated oils. ? Saturated fat (g) in each serving. ? Cholesterol (mg) in each serving. ? Fiber (g) in each serving.  Choose foods with healthy fats, such as: ? Monounsaturated fats. ? Polyunsaturated fats. ? Omega-3 fats.  Choose grain products that have whole grains. Look for the word "whole" as the first word in the ingredient list. Cooking  Cook foods using low-fat methods. These include baking, boiling, grilling, and broiling.  Eat more home-cooked foods. Eat at restaurants and buffets less often.  Avoid cooking using saturated fats, such as butter, cream, palm oil, palm kernel oil, and  coconut oil. Recommended foods  Fruits  All fresh, canned (in natural juice), or frozen fruits. Vegetables  Fresh or frozen vegetables (raw, steamed, roasted, or grilled). Green salads. Grains  Whole grains, such as whole wheat or whole grain breads, crackers, cereals, and pasta. Unsweetened oatmeal, bulgur, barley, quinoa, or brown rice. Corn or whole wheat flour tortillas. Meats and other protein foods  Ground beef (85% or leaner), grass-fed beef, or beef trimmed of fat. Skinless chicken or turkey. Ground chicken or turkey. Pork trimmed of fat. All fish and seafood. Egg whites. Dried beans, peas, or lentils. Unsalted nuts or seeds. Unsalted canned beans. Nut butters without added sugar or oil. Dairy  Low-fat or nonfat dairy products, such as skim or 1% milk, 2% or reduced-fat cheeses, low-fat and fat-free ricotta or cottage cheese, or plain low-fat and nonfat yogurt. Fats and oils  Tub margarine without trans fats. Light or reduced-fat mayonnaise and salad dressings. Avocado. Olive, canola, sesame, or safflower oils. The items listed above may not be a complete list of foods and beverages you can eat. Contact a dietitian for more information.   Foods to avoid Fruits  Canned fruit in heavy syrup. Fruit in cream or butter sauce. Fried fruit. Vegetables  Vegetables cooked in cheese, cream, or butter sauce. Fried vegetables. Grains  White bread. White pasta. White rice. Cornbread. Bagels, pastries, and croissants. Crackers and snack foods that contain trans fat and hydrogenated oils. Meats and other protein foods  Fatty cuts of meat. Ribs, chicken wings, bacon, sausage, bologna, salami, chitterlings, fatback, hot dogs, bratwurst, and packaged lunch meats. Liver and organ meats. Whole eggs and egg yolks. Chicken and turkey with skin. Fried meat. Dairy  Whole or 2% milk, cream, half-and-half, and cream cheese. Whole milk cheeses. Whole-fat or sweetened yogurt. Full-fat cheeses.  Nondairy creamers and whipped toppings. Processed cheese, cheese spreads, and cheese curds. Beverages  Alcohol. Sugar-sweetened drinks such as sodas, lemonade, and fruit drinks. Fats and oils  Butter, stick margarine, lard, shortening, ghee, or bacon fat. Coconut, palm kernel, and palm oils. Sweets and desserts  Corn syrup, sugars, honey, and molasses. Candy. Jam and jelly. Syrup. Sweetened cereals. Cookies, pies, cakes, donuts, muffins, and ice cream. The items listed above may not be a complete list of foods and beverages you should avoid. Contact a dietitian for more information. Summary  Choosing the right foods helps keep your fat and cholesterol at normal levels. This can keep you from getting certain diseases.  At meals, fill one-half of your plate with vegetables and green salads.  Eat high-fiber foods, like whole grains, beans, apples, carrots, peas, and barley.  Limit added sugar, saturated fats, alcohol, and fried foods. This information is not intended to replace advice given to you by your health care provider. Make sure you discuss any questions you have with your health care provider. Document Released: 12/17/2011 Document Revised: 02/18/2018 Document Reviewed: 03/04/2017 Elsevier Patient Education  2020 Elsevier Inc.   

## 2019-03-11 ENCOUNTER — Other Ambulatory Visit: Payer: Self-pay

## 2019-03-11 ENCOUNTER — Ambulatory Visit: Payer: Self-pay | Admitting: Licensed Clinical Social Worker

## 2019-03-11 DIAGNOSIS — Z789 Other specified health status: Secondary | ICD-10-CM

## 2019-03-11 NOTE — BH Specialist Note (Signed)
  Patient requested a follow up appointment due to being confused about eligibility requirements and what she has to turn in to get her medications. She explained that she still had not heard from the dentist.  Clinician contacted the patient via phone to explain to the patient that when she turned in her dental application along with the 20.00 co pay that she should have been provided with a receipt and two orange cards to take to her first two dental visits. She checked her dental application and receipt book. She informed the patient the orange cards for the dentist were placed in the mail to her a week ago and suggested she check her po box. She explained that in terms of turning in documentation to get her medications, that she thinks she has them confused with Medication Management. She explained to the patient that it sounds like she needs to turn in her 2019 tax documents and that [facility] still needs a utility bill for proof of residence in [place]. She provided the patient the hours to both medication management and [facility].

## 2019-03-31 ENCOUNTER — Ambulatory Visit: Payer: Self-pay | Admitting: Pharmacy Technician

## 2019-03-31 ENCOUNTER — Other Ambulatory Visit: Payer: Self-pay

## 2019-03-31 DIAGNOSIS — Z79899 Other long term (current) drug therapy: Secondary | ICD-10-CM

## 2019-03-31 NOTE — Progress Notes (Signed)
Completed [facility] application and contract.  Patient agreed to all terms of the [facility] contract.    Patient approved to receive medication assistance at [facility] as long as eligibility criteria continues to be met.    Patient will be eligible for Medicare 03/01/20.  Discussed how to be screened for a Medicare Saving Plan and L.I.S.  Also, patient stated that she understood that [facility] will not be able to provide medication assistance once she obtains Medicare and a Part D plan.  Provided patient with community resource material based on her particular needs.    [facility]

## 2019-04-21 ENCOUNTER — Other Ambulatory Visit: Payer: Self-pay

## 2019-04-21 DIAGNOSIS — E890 Postprocedural hypothyroidism: Secondary | ICD-10-CM

## 2019-04-22 ENCOUNTER — Telehealth: Payer: Self-pay | Admitting: Gerontology

## 2019-04-22 LAB — TSH: TSH: 0.918 u[IU]/mL (ref 0.450–4.500)

## 2019-04-22 NOTE — Telephone Encounter (Signed)
Called 10/22 to screen for upcoming appt on 10/29. No answer and no release of information on document list so no message left.

## 2019-04-29 ENCOUNTER — Ambulatory Visit: Payer: Self-pay | Admitting: Gerontology

## 2019-05-18 ENCOUNTER — Ambulatory Visit: Payer: Self-pay | Admitting: Gerontology

## 2019-05-18 ENCOUNTER — Other Ambulatory Visit: Payer: Self-pay

## 2019-05-18 VITALS — BP 101/67 | HR 58 | Wt 206.0 lb

## 2019-05-18 DIAGNOSIS — R200 Anesthesia of skin: Secondary | ICD-10-CM

## 2019-05-18 DIAGNOSIS — E785 Hyperlipidemia, unspecified: Secondary | ICD-10-CM

## 2019-05-18 DIAGNOSIS — I6350 Cerebral infarction due to unspecified occlusion or stenosis of unspecified cerebral artery: Secondary | ICD-10-CM

## 2019-05-18 DIAGNOSIS — E890 Postprocedural hypothyroidism: Secondary | ICD-10-CM

## 2019-05-18 DIAGNOSIS — Z0000 Encounter for general adult medical examination without abnormal findings: Secondary | ICD-10-CM

## 2019-05-18 DIAGNOSIS — I10 Essential (primary) hypertension: Secondary | ICD-10-CM

## 2019-05-18 DIAGNOSIS — R202 Paresthesia of skin: Secondary | ICD-10-CM | POA: Insufficient documentation

## 2019-05-18 MED ORDER — LISINOPRIL 10 MG PO TABS: 10.0000 mg | ORAL_TABLET | Freq: Every day | ORAL | 1 refills | Status: DC

## 2019-05-18 MED ORDER — CLOPIDOGREL BISULFATE 75 MG PO TABS: 75.0000 mg | ORAL_TABLET | Freq: Every day | ORAL | 1 refills | Status: DC

## 2019-05-18 MED ORDER — GABAPENTIN 100 MG PO CAPS: 100.0000 mg | ORAL_CAPSULE | Freq: Every day | ORAL | 0 refills | Status: DC

## 2019-05-18 MED ORDER — LEVOTHYROXINE SODIUM 175 MCG PO TABS: 175.0000 ug | ORAL_TABLET | Freq: Every day | ORAL | 1 refills | Status: DC

## 2019-05-18 MED ORDER — ATORVASTATIN CALCIUM 40 MG PO TABS: 40.0000 mg | ORAL_TABLET | Freq: Every day | ORAL | 1 refills | Status: DC

## 2019-05-18 NOTE — Patient Instructions (Signed)

## 2019-05-18 NOTE — Progress Notes (Signed)
Established Patient Office Visit  Subjective:  Patient ID: Laurie Valenzuela, female    DOB: Nov 07, 1955  Age: 63 y.o. MRN: [id number]  CC:  Chief Complaint  Patient presents with  . Hypertension    HPI Laurie Valenzuela presents for follow up of hypertension, hyperlipidemia, hypothyroidism and medication refill. She's compliant with her medications and she reports that she's doing well. She checks her blood pressure at home and she states that it's less than 130/90. Her TSH done on 04/21/2019 was 0.918, she reports taking 175 mcg of levothyroxine as prescribed. She denies fatigue,constipation and cold or heat intolerance.   Currently, she c/o worsening intermittent numbness and tingling to right 3rd,4 & 5th toes, ball and lateral aspect of right foot. She states that it started 7 weeks ago and it is worsening. She states that numbness and tingling starts when she lay down horizontally to sleep at night. She reports that numbness and  tingling sensation intensity increases up to 8/10, and it wakes her up at night every hour. She states that numbness and tingling sensation resolves by putting her feet on the floor. Monofilament test was done, it was normal on her left foot and decreased sensation and vibration to right 3rd,4&5th toes, ball and lateral aspect of right foot. She denies chest pain, palpitation, hematuria, hematochezia, active bleeding, motor weakness, fever, chills and she offers no further complaint.  Past Medical History:  Diagnosis Date  . HTN (hypertension)   . Hyperlipidemia   . Hypothyroidism   . Stroke [facility])    2017, 08/2018    Past Surgical History:  Procedure Laterality Date  . CESAREAN SECTION    . TIBIA IM NAIL INSERTION Right 09/03/2018   Procedure: INTRAMEDULLARY (IM) NAIL TIBIAL;  Surgeon: Myrene GalasHandy, Michael, MD;  Location: [facility] OR;  Service: Orthopedics;  Laterality: Right;    Family History  Problem Relation Age of Onset  . Depression  Father     Social History   Socioeconomic History  . Marital status: Single    Spouse name: Not on file  . Number of children: Not on file  . Years of education: Not on file  . Highest education level: Bachelor's degree (e.g., BA, AB, BS)  Occupational History    Comment: na  Social Needs  . Financial resource strain: Not on file  . Food insecurity    Worry: Not on file    Inability: Not on file  . Transportation needs    Medical: Not on file    Non-medical: Not on file  Tobacco Use  . Smoking status: Former Smoker    Packs/day: 1.00    Years: 40.00    Pack years: 40.00  . Smokeless tobacco: Never Used  Substance and Sexual Activity  . Alcohol use: Yes    Alcohol/week: 2.0 standard drinks    Types: 2 Standard drinks or equivalent per week  . Drug use: Never  . Sexual activity: Not on file  Lifestyle  . Physical activity    Days per week: Not on file    Minutes per session: Not on file  . Stress: Not on file  Relationships  . Social Musicianconnections    Talks on phone: Not on file    Gets together: Not on file    Attends religious service: Not on file    Active member of club or organization: Not on file    Attends meetings of clubs or organizations: Not on file    Relationship status:  Not on file  . Intimate partner violence    Fear of current or ex partner: Not on file    Emotionally abused: Not on file    Physically abused: Not on file    Forced sexual activity: Not on file  Other Topics Concern  . Not on file  Social History Narrative   Lives alone   Caffeine 1-2 day    Outpatient Medications Prior to Visit  Medication Sig Dispense Refill  . Multiple Vitamins-Minerals (MULTIVITAMIN WITH MINERALS) tablet Take 1 tablet by mouth daily.    Marland Kitchen atorvastatin (LIPITOR) 40 MG tablet Take 1 tablet (40 mg total) by mouth daily at 6 PM. 30 tablet 1  . clopidogrel (PLAVIX) 75 MG tablet Take 1 tablet (75 mg total) by mouth daily. 30 tablet 2  . levothyroxine (SYNTHROID)  175 MCG tablet Take 1 tablet (175 mcg total) by mouth daily. 30 tablet 1  . lisinopril (ZESTRIL) 10 MG tablet Take 1 tablet (10 mg total) by mouth daily. 30 tablet 1   No facility-administered medications prior to visit.     Allergies  Allergen Reactions  . Codeine     ROS Review of Systems  Constitutional: Negative.   Respiratory: Negative.   Cardiovascular: Negative.   Endocrine: Negative.   Musculoskeletal: Negative.   Skin: Negative.   Neurological: Positive for numbness (right 3rd, 4&5th toes, ball and lateral aspect of right foot.).  Psychiatric/Behavioral: Negative.       Objective:    Physical Exam  Constitutional: She is oriented to person, place, and time. She appears well-developed.  HENT:  Head: Normocephalic and atraumatic.  Eyes: Pupils are equal, round, and reactive to light. EOM are normal.  Cardiovascular: Normal rate and regular rhythm.  Pulmonary/Chest: Effort normal and breath sounds normal.  Musculoskeletal: Normal range of motion.  Neurological: She is alert and oriented to person, place, and time.  Skin: Skin is warm and dry.  Psychiatric: She has a normal mood and affect. Her behavior is normal. Judgment and thought content normal.    BP 101/67 (BP Location: Right Arm, Patient Position: Sitting)   Pulse (!) 58   Wt 206 lb (93.4 kg)   SpO2 100%   BMI 29.56 kg/m  Wt Readings from Last 3 Encounters:  05/18/19 206 lb (93.4 kg)  03/04/19 203 lb (92.1 kg)  02/11/19 204 lb (92.5 kg)     Health Maintenance Due  Topic Date Due  . Hepatitis C Screening  05/27/55  . TETANUS/TDAP  03/29/1974  . PAP SMEAR-Modifier  03/29/1976  . MAMMOGRAM  03/29/2005  . COLONOSCOPY  03/29/2005  . INFLUENZA VACCINE  01/30/2019    There are no preventive care reminders to display for this patient.  Lab Results  Component Value Date   TSH 0.918 04/21/2019   Lab Results  Component Value Date   WBC 10.2 09/05/2018   HGB 11.2 (L) 09/05/2018   HCT 34.1 (L)  09/05/2018   MCV 95.3 09/05/2018   PLT 230 09/05/2018   Lab Results  Component Value Date   NA 142 02/24/2019   K 4.2 02/24/2019   CO2 23 02/24/2019   GLUCOSE 92 02/24/2019   BUN 16 02/24/2019   CREATININE 0.79 02/24/2019   BILITOT 0.3 02/24/2019   ALKPHOS 106 02/24/2019   AST 15 02/24/2019   ALT 16 02/24/2019   PROT 7.3 02/24/2019   ALBUMIN 4.6 02/24/2019   CALCIUM 9.5 02/24/2019   ANIONGAP 10 09/05/2018   Lab Results  Component Value Date  CHOL 156 02/24/2019   Lab Results  Component Value Date   HDL 46 02/24/2019   Lab Results  Component Value Date   LDLCALC 89 02/24/2019   Lab Results  Component Value Date   TRIG 103 02/24/2019   Lab Results  Component Value Date   CHOLHDL 3.4 02/24/2019   Lab Results  Component Value Date   HGBA1C 5.4 08/28/2018      Assessment & Plan:    1. Hyperlipidemia, unspecified hyperlipidemia type - She will continue on current treatment regimen. -Low fat Diet, like low fat dairy products eg skimmed milk -Avoid any fried food -Regular exercise/walk -Goal for Total Cholesterol is less than 200 -Goal for bad cholesterol LDL is less than 100 -Goal for Good cholesterol HDL is more than 45 -Goal for Triglyceride is less than 150 - atorvastatin (LIPITOR) 40 MG tablet; Take 1 tablet (40 mg total) by mouth daily at 6 PM.  Dispense: 90 tablet; Refill: 1  2. Cerebrovascular accident (CVA) due to occlusion of cerebral artery ([place]) - She will continue on current treatment regimen and advised to go to the ED for active bleeding. - clopidogrel (PLAVIX) 75 MG tablet; Take 1 tablet (75 mg total) by mouth daily.  Dispense: 90 tablet; Refill: 1 - She was encouraged to complete charity care application for Ambulatory referral to Neurology  3. Hypothyroidism, postablative - She is Euthyroid and will continue on current treatment regimen. - levothyroxine (SYNTHROID) 175 MCG tablet; Take 1 tablet (175 mcg total) by mouth daily.  Dispense:  90 tablet; Refill: 1  4. Essential (primary) hypertension - Her blood pressure is controlled and she will continue on current treatment regimen. - She was advised to continue on Low salt DASH diet -Take medications regularly on time -Exercise regularly as tolerated -Check blood pressure at least once a week at home, record and bring log to office visit. -Goal is less than 150/90 and normal blood pressure is 120/80 - lisinopril (ZESTRIL) 10 MG tablet; Take 1 tablet (10 mg total) by mouth daily.  Dispense: 90 tablet; Refill: 1  5. Health care maintenance  - Flu Vaccine QUAD 6+ mos PF IM (Fluarix Quad PF) was administered  6. Numbness and tingling - She will start gabapentin 100 mg at bedtime, was educated on medication side effects and advised to notify clinic. - Ambulatory referral to Neurology - gabapentin (NEURONTIN) 100 MG capsule; Take 1 capsule (100 mg total) by mouth at bedtime.  Dispense: 30 capsule; Refill: 0    Follow-up: Return in about 1 month (around 06/17/2019), or if symptoms worsen or fail to improve.    Laurie Laurich Jerold Coombe, NP

## 2019-06-17 ENCOUNTER — Ambulatory Visit: Payer: Self-pay | Admitting: Gerontology

## 2019-06-17 ENCOUNTER — Encounter: Payer: Self-pay | Admitting: Gerontology

## 2019-06-17 ENCOUNTER — Other Ambulatory Visit: Payer: Self-pay

## 2019-06-17 VITALS — BP 108/74 | HR 60 | Ht 70.0 in | Wt 211.0 lb

## 2019-06-17 DIAGNOSIS — R202 Paresthesia of skin: Secondary | ICD-10-CM

## 2019-06-17 DIAGNOSIS — I6350 Cerebral infarction due to unspecified occlusion or stenosis of unspecified cerebral artery: Secondary | ICD-10-CM

## 2019-06-17 DIAGNOSIS — R200 Anesthesia of skin: Secondary | ICD-10-CM

## 2019-06-17 MED ORDER — GABAPENTIN 100 MG PO CAPS: 100.0000 mg | ORAL_CAPSULE | Freq: Two times a day (BID) | ORAL | 1 refills | Status: DC

## 2019-06-17 NOTE — Patient Instructions (Signed)

## 2019-06-17 NOTE — Progress Notes (Signed)
Established Patient Office Visit  Subjective:  Patient ID: Laurie Valenzuela, female    DOB: 1955-09-15  Age: 63 y.o. MRN: [id number]  CC:  Chief Complaint  Patient presents with  . Hypertension  . Hypothyroidism    HPI [facility] presents for follow up of intermittent numbness and tingling to her right 3rd, 4 & 5th toes, ball and lateral aspect of right foot. She states that she's compliant with her medications. She states that their is no improvement with the paresthesia. 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 She states that numbness doesn't wake her up at night, but she experiences constant burning and tingling sensation during the day. She states that it doesn't affect her walking, but the feeling she has is in her mind. She states that taking 100 mg gabapentin at night relieves her symptoms. She never followed up with a Neurologist after hospital discharge on 08/28/2018 for CVA because she has no insurance. She denies motor weakness, and doesn't limp anymore and since her [facility] charity care is active, she requests a Neurology follow up. She denies chest pain, palpitation, dizziness, fever and chills. She states that she's doing well and offers no further complaint.  Past Medical History:  Diagnosis Date  . HTN (hypertension)   . Hyperlipidemia   . Hypothyroidism   . Stroke [facility])    2017, 08/2018    Past Surgical History:  Procedure Laterality Date  . CESAREAN SECTION    . TIBIA IM NAIL INSERTION Right 09/03/2018   Procedure: INTRAMEDULLARY (IM) NAIL TIBIAL;  Surgeon: Myrene Galas, MD;  Location: [facility] OR;  Service: Orthopedics;  Laterality: Right;    Family History  Problem Relation Age of Onset  . Depression Father     Social History   Socioeconomic History  . Marital status: Single    Spouse name: Not on  file  . Number of children: Not on file  . Years of education: Not on file  . Highest education level: Bachelor's degree (e.g., BA, AB, BS)  Occupational History    Comment: na  Tobacco Use  . Smoking status: Former Smoker    Packs/day: 1.00    Years: 40.00    Pack years: 40.00  . Smokeless tobacco: Never Used  Substance and Sexual Activity  . Alcohol use: Yes    Alcohol/week: 2.0 standard drinks    Types: 2 Standard drinks or equivalent per week  . Drug use: Never  . Sexual activity: Not on file  Other Topics Concern  . Not on file  Social History Narrative   Lives alone   Caffeine 1-2 day   Social Determinants of Health   Financial Resource Strain:   . Difficulty of Paying Living Expenses: Not on file  Food Insecurity:   . Worried About Programme researcher, broadcasting/film/video in the Last Year: Not on file  . Ran Out of Food in the Last Year: Not on file  Transportation Needs:   . Lack of Transportation (Medical): Not on file  . Lack of Transportation (Non-Medical): Not on file  Physical Activity:   . Days of Exercise per Week: Not on file  . Minutes of Exercise per Session: Not on file  Stress:   . Feeling of Stress : Not on file  Social Connections:   . Frequency of Communication with Friends and Family:  Not on file  . Frequency of Social Gatherings with Friends and Family: Not on file  . Attends Religious Services: Not on file  . Active Member of Clubs or Organizations: Not on file  . Attends BankerClub or Organization Meetings: Not on file  . Marital Status: Not on file  Intimate Partner Violence:   . Fear of Current or Ex-Partner: Not on file  . Emotionally Abused: Not on file  . Physically Abused: Not on file  . Sexually Abused: Not on file    Outpatient Medications Prior to Visit  Medication Sig Dispense Refill  . atorvastatin (LIPITOR) 40 MG tablet Take 1 tablet (40 mg total) by mouth daily at 6 PM. 90 tablet 1  . clopidogrel (PLAVIX) 75 MG tablet Take 1 tablet (75 mg total) by  mouth daily. 90 tablet 1  . levothyroxine (SYNTHROID) 175 MCG tablet Take 1 tablet (175 mcg total) by mouth daily. 90 tablet 1  . lisinopril (ZESTRIL) 10 MG tablet Take 1 tablet (10 mg total) by mouth daily. 90 tablet 1  . Multiple Vitamins-Minerals (MULTIVITAMIN WITH MINERALS) tablet Take 1 tablet by mouth daily.    Marland Kitchen. gabapentin (NEURONTIN) 100 MG capsule Take 1 capsule (100 mg total) by mouth at bedtime. 30 capsule 0   No facility-administered medications prior to visit.    Allergies  Allergen Reactions  . Codeine     ROS Review of Systems  Constitutional: Negative.   Eyes: Negative.   Respiratory: Negative.   Cardiovascular: Negative.   Endocrine: Negative.   Neurological: Positive for numbness.  Hematological: Negative.   Psychiatric/Behavioral: Negative.       Objective:    Physical Exam  Constitutional: She is oriented to person, place, and time. She appears well-developed.  HENT:  Head: Normocephalic and atraumatic.  Eyes: Pupils are equal, round, and reactive to light. EOM are normal.  Cardiovascular: Normal rate and regular rhythm.  Pulmonary/Chest: Effort normal and breath sounds normal.  Neurological: She is alert and oriented to person, place, and time. She has normal strength and normal reflexes. No cranial nerve deficit or sensory deficit. Coordination and gait normal. GCS eye subscore is 4. GCS verbal subscore is 5. GCS motor subscore is 6.  Skin: Skin is warm and dry.  Psychiatric: She has a normal mood and affect. Her behavior is normal. Judgment and thought content normal.    BP 108/74 (BP Location: Left Arm, Patient Position: Sitting)   Pulse 60   Ht 5\' 10"  (1.778 m)   Wt 211 lb (95.7 kg)   SpO2 97%   BMI 30.28 kg/m  Wt Readings from Last 3 Encounters:  06/17/19 211 lb (95.7 kg)  05/18/19 206 lb (93.4 kg)  03/04/19 203 lb (92.1 kg)   She gained 5 pounds in one month and was encouraged to lose weight  Health Maintenance Due  Topic Date Due  .  Hepatitis C Screening  05-21-1956  . TETANUS/TDAP  03/29/1974  . PAP SMEAR-Modifier  03/29/1976  . MAMMOGRAM  03/29/2005  . COLONOSCOPY  03/29/2005    There are no preventive care reminders to display for this patient.  Lab Results  Component Value Date   TSH 0.918 04/21/2019   Lab Results  Component Value Date   WBC 10.2 09/05/2018   HGB 11.2 (L) 09/05/2018   HCT 34.1 (L) 09/05/2018   MCV 95.3 09/05/2018   PLT 230 09/05/2018   Lab Results  Component Value Date   NA 142 02/24/2019   K 4.2 02/24/2019  CO2 23 02/24/2019   GLUCOSE 92 02/24/2019   BUN 16 02/24/2019   CREATININE 0.79 02/24/2019   BILITOT 0.3 02/24/2019   ALKPHOS 106 02/24/2019   AST 15 02/24/2019   ALT 16 02/24/2019   PROT 7.3 02/24/2019   ALBUMIN 4.6 02/24/2019   CALCIUM 9.5 02/24/2019   ANIONGAP 10 09/05/2018   Lab Results  Component Value Date   CHOL 156 02/24/2019   Lab Results  Component Value Date   HDL 46 02/24/2019   Lab Results  Component Value Date   LDLCALC 89 02/24/2019   Lab Results  Component Value Date   TRIG 103 02/24/2019   Lab Results  Component Value Date   CHOLHDL 3.4 02/24/2019   Lab Results  Component Value Date   HGBA1C 5.4 08/28/2018      Assessment & Plan:   1. Numbness and tingling - Her paresthesia is controlled at night time, gabapentin will be increased to 100 mg twice daily - gabapentin (NEURONTIN) 100 MG capsule; Take 1 capsule (100 mg total) by mouth 2 (two) times daily.  Dispense: 60 capsule; Refill: 1 - Ambulatory referral to Neurology  2. 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     Follow-up: Return in about 3 months (around 09/15/2019), or if symptoms worsen or fail to improve.    Dorothe Elmore Jerold Coombe, NP

## 2019-07-26 ENCOUNTER — Institutional Professional Consult (permissible substitution): Payer: Self-pay | Admitting: Diagnostic Neuroimaging

## 2019-09-15 ENCOUNTER — Other Ambulatory Visit: Payer: Self-pay

## 2019-09-15 ENCOUNTER — Ambulatory Visit: Payer: Self-pay | Admitting: Gerontology

## 2019-09-15 ENCOUNTER — Encounter: Payer: Self-pay | Admitting: Gerontology

## 2019-09-15 VITALS — BP 124/84 | HR 60 | Ht 70.0 in | Wt 215.2 lb

## 2019-09-15 DIAGNOSIS — I6350 Cerebral infarction due to unspecified occlusion or stenosis of unspecified cerebral artery: Secondary | ICD-10-CM

## 2019-09-15 DIAGNOSIS — E785 Hyperlipidemia, unspecified: Secondary | ICD-10-CM

## 2019-09-15 DIAGNOSIS — E890 Postprocedural hypothyroidism: Secondary | ICD-10-CM

## 2019-09-15 DIAGNOSIS — Z0000 Encounter for general adult medical examination without abnormal findings: Secondary | ICD-10-CM

## 2019-09-15 DIAGNOSIS — R200 Anesthesia of skin: Secondary | ICD-10-CM

## 2019-09-15 DIAGNOSIS — R202 Paresthesia of skin: Secondary | ICD-10-CM

## 2019-09-15 DIAGNOSIS — I10 Essential (primary) hypertension: Secondary | ICD-10-CM

## 2019-09-15 MED ORDER — LEVOTHYROXINE SODIUM 175 MCG PO TABS: 175.0000 ug | ORAL_TABLET | Freq: Every day | ORAL | 1 refills | Status: DC

## 2019-09-15 MED ORDER — LISINOPRIL 10 MG PO TABS: 10.0000 mg | ORAL_TABLET | Freq: Every day | ORAL | 1 refills | Status: DC

## 2019-09-15 MED ORDER — GABAPENTIN 100 MG PO CAPS: 100.0000 mg | ORAL_CAPSULE | Freq: Two times a day (BID) | ORAL | 1 refills | Status: DC

## 2019-09-15 MED ORDER — CLOPIDOGREL BISULFATE 75 MG PO TABS: 75.0000 mg | ORAL_TABLET | Freq: Every day | ORAL | 1 refills | Status: DC

## 2019-09-15 MED ORDER — ATORVASTATIN CALCIUM 40 MG PO TABS: 40.0000 mg | ORAL_TABLET | Freq: Every day | ORAL | 1 refills | Status: DC

## 2019-09-15 NOTE — Patient Instructions (Signed)
Managing Your Hypertension Hypertension is commonly called high blood pressure. This is when the force of your blood pressing against the walls of your arteries is too strong. Arteries are blood vessels that carry blood from your heart throughout your body. Hypertension forces the heart to work harder to pump blood, and may cause the arteries to become narrow or stiff. Having untreated or uncontrolled hypertension can cause heart attack, stroke, kidney disease, and other problems. What are blood pressure readings? A blood pressure reading consists of a higher number over a lower number. Ideally, your blood pressure should be below 120/80. The first ("top") number is called the systolic pressure. It is a measure of the pressure in your arteries as your heart beats. The second ("bottom") number is called the diastolic pressure. It is a measure of the pressure in your arteries as the heart relaxes. What does my blood pressure reading mean? Blood pressure is classified into four stages. Based on your blood pressure reading, your health care provider may use the following stages to determine what type of treatment you need, if any. Systolic pressure and diastolic pressure are measured in a unit called mm Hg. Normal  Systolic pressure: below 120.  Diastolic pressure: below 80. Elevated  Systolic pressure: 120-129.  Diastolic pressure: below 80. Hypertension stage 1  Systolic pressure: 130-139.  Diastolic pressure: 80-89. Hypertension stage 2  Systolic pressure: 140 or above.  Diastolic pressure: 90 or above. What health risks are associated with hypertension? Managing your hypertension is an important responsibility. Uncontrolled hypertension can lead to:  A heart attack.  A stroke.  A weakened blood vessel (aneurysm).  Heart failure.  Kidney damage.  Eye damage.  Metabolic syndrome.  Memory and concentration problems. What changes can I make to manage my  hypertension? Hypertension can be managed by making lifestyle changes and possibly by taking medicines. Your health care provider will help you make a plan to bring your blood pressure within a normal range. Eating and drinking   Eat a diet that is high in fiber and potassium, and low in salt (sodium), added sugar, and fat. An example eating plan is called the DASH (Dietary Approaches to Stop Hypertension) diet. To eat this way: ? Eat plenty of fresh fruits and vegetables. Try to fill half of your plate at each meal with fruits and vegetables. ? Eat whole grains, such as whole wheat pasta, brown rice, or whole grain bread. Fill about one quarter of your plate with whole grains. ? Eat low-fat diary products. ? Avoid fatty cuts of meat, processed or cured meats, and poultry with skin. Fill about one quarter of your plate with lean proteins such as fish, chicken without skin, beans, eggs, and tofu. ? Avoid premade and processed foods. These tend to be higher in sodium, added sugar, and fat.  Reduce your daily sodium intake. Most people with hypertension should eat less than 1,500 mg of sodium a day.  Limit alcohol intake to no more than 1 drink a day for nonpregnant women and 2 drinks a day for men. One drink equals 12 oz of beer, 5 oz of wine, or 1 oz of hard liquor. Lifestyle  Work with your health care provider to maintain a healthy body weight, or to lose weight. Ask what an ideal weight is for you.  Get at least 30 minutes of exercise that causes your heart to beat faster (aerobic exercise) most days of the week. Activities may include walking, swimming, or biking.  Include exercise   to strengthen your muscles (resistance exercise), such as weight lifting, as part of your weekly exercise routine. Try to do these types of exercises for 30 minutes at least 3 days a week.  Do not use any products that contain nicotine or tobacco, such as cigarettes and e-cigarettes. If you need help quitting,  ask your health care provider.  Control any long-term (chronic) conditions you have, such as high cholesterol or diabetes. Monitoring  Monitor your blood pressure at home as told by your health care provider. Your personal target blood pressure may vary depending on your medical conditions, your age, and other factors.  Have your blood pressure checked regularly, as often as told by your health care provider. Working with your health care provider  Review all the medicines you take with your health care provider because there may be side effects or interactions.  Talk with your health care provider about your diet, exercise habits, and other lifestyle factors that may be contributing to hypertension.  Visit your health care provider regularly. Your health care provider can help you create and adjust your plan for managing hypertension. Will I need medicine to control my blood pressure? Your health care provider may prescribe medicine if lifestyle changes are not enough to get your blood pressure under control, and if:  Your systolic blood pressure is 130 or higher.  Your diastolic blood pressure is 80 or higher. Take medicines only as told by your health care provider. Follow the directions carefully. Blood pressure medicines must be taken as prescribed. The medicine does not work as well when you skip doses. Skipping doses also puts you at risk for problems. Contact a health care provider if:  You think you are having a reaction to medicines you have taken.  You have repeated (recurrent) headaches.  You feel dizzy.  You have swelling in your ankles.  You have trouble with your vision. Get help right away if:  You develop a severe headache or confusion.  You have unusual weakness or numbness, or you feel faint.  You have severe pain in your chest or abdomen.  You vomit repeatedly.  You have trouble breathing. Summary  Hypertension is when the force of blood pumping  through your arteries is too strong. If this condition is not controlled, it may put you at risk for serious complications.  Your personal target blood pressure may vary depending on your medical conditions, your age, and other factors. For most people, a normal blood pressure is less than 120/80.  Hypertension is managed by lifestyle changes, medicines, or both. Lifestyle changes include weight loss, eating a healthy, low-sodium diet, exercising more, and limiting alcohol. This information is not intended to replace advice given to you by your health care provider. Make sure you discuss any questions you have with your health care provider. Document Revised: 10/09/2018 Document Reviewed: 05/15/2016 Elsevier Patient Education  2020 Elsevier Inc.  Steps to Quit Smoking Smoking tobacco is the leading cause of preventable death. It can affect almost every organ in the body. Smoking puts you and people around you at risk for many serious, long-lasting (chronic) diseases. Quitting smoking can be hard, but it is one of the best things that you can do for your health. It is never too late to quit. How do I get ready to quit? When you decide to quit smoking, make a plan to help you succeed. Before you quit:  Pick a date to quit. Set a date within the next 2 weeks to   give you time to prepare.  Write down the reasons why you are quitting. Keep this list in places where you will see it often.  Tell your family, friends, and co-workers that you are quitting. Their support is important.  Talk with your doctor about the choices that may help you quit.  Find out if your health insurance will pay for these treatments.  Know the people, places, things, and activities that make you want to smoke (triggers). Avoid them. What first steps can I take to quit smoking?  Throw away all cigarettes at home, at work, and in your car.  Throw away the things that you use when you smoke, such as ashtrays and  lighters.  Clean your car. Make sure to empty the ashtray.  Clean your home, including curtains and carpets. What can I do to help me quit smoking? Talk with your doctor about taking medicines and seeing a counselor at the same time. You are more likely to succeed when you do both.  If you are pregnant or breastfeeding, talk with your doctor about counseling or other ways to quit smoking. Do not take medicine to help you quit smoking unless your doctor tells you to do so. To quit smoking: Quit right away  Quit smoking totally, instead of slowly cutting back on how much you smoke over a period of time.  Go to counseling. You are more likely to quit if you go to counseling sessions regularly. Take medicine You may take medicines to help you quit. Some medicines need a prescription, and some you can buy over-the-counter. Some medicines may contain a drug called nicotine to replace the nicotine in cigarettes. Medicines may:  Help you to stop having the desire to smoke (cravings).  Help to stop the problems that come when you stop smoking (withdrawal symptoms). Your doctor may ask you to use:  Nicotine patches, gum, or lozenges.  Nicotine inhalers or sprays.  Non-nicotine medicine that is taken by mouth. Find resources Find resources and other ways to help you quit smoking and remain smoke-free after you quit. These resources are most helpful when you use them often. They include:  Online chats with a counselor.  Phone quitlines.  Printed self-help materials.  Support groups or group counseling.  Text messaging programs.  Mobile phone apps. Use apps on your mobile phone or tablet that can help you stick to your quit plan. There are many free apps for mobile phones and tablets as well as websites. Examples include Quit Guide from the CDC and smokefree.gov  What things can I do to make it easier to quit?   Talk to your family and friends. Ask them to support and encourage  you.  Call a phone quitline (1-800-QUIT-NOW), reach out to support groups, or work with a counselor.  Ask people who smoke to not smoke around you.  Avoid places that make you want to smoke, such as: ? Bars. ? Parties. ? Smoke-break areas at work.  Spend time with people who do not smoke.  Lower the stress in your life. Stress can make you want to smoke. Try these things to help your stress: ? Getting regular exercise. ? Doing deep-breathing exercises. ? Doing yoga. ? Meditating. ? Doing a body scan. To do this, close your eyes, focus on one area of your body at a time from head to toe. Notice which parts of your body are tense. Try to relax the muscles in those areas. How will I feel when I   quit smoking? Day 1 to 3 weeks Within the first 24 hours, you may start to have some problems that come from quitting tobacco. These problems are very bad 2-3 days after you quit, but they do not often last for more than 2-3 weeks. You may get these symptoms:  Mood swings.  Feeling restless, nervous, angry, or annoyed.  Trouble concentrating.  Dizziness.  Strong desire for high-sugar foods and nicotine.  Weight gain.  Trouble pooping (constipation).  Feeling like you may vomit (nausea).  Coughing or a sore throat.  Changes in how the medicines that you take for other issues work in your body.  Depression.  Trouble sleeping (insomnia). Week 3 and afterward After the first 2-3 weeks of quitting, you may start to notice more positive results, such as:  Better sense of smell and taste.  Less coughing and sore throat.  Slower heart rate.  Lower blood pressure.  Clearer skin.  Better breathing.  Fewer sick days. Quitting smoking can be hard. Do not give up if you fail the first time. Some people need to try a few times before they succeed. Do your best to stick to your quit plan, and talk with your doctor if you have any questions or concerns. Summary  Smoking tobacco is  the leading cause of preventable death. Quitting smoking can be hard, but it is one of the best things that you can do for your health.  When you decide to quit smoking, make a plan to help you succeed.  Quit smoking right away, not slowly over a period of time.  When you start quitting, seek help from your doctor, family, or friends. This information is not intended to replace advice given to you by your health care provider. Make sure you discuss any questions you have with your health care provider. Document Revised: 03/12/2019 Document Reviewed: 09/05/2018 Elsevier Patient Education  2020 Elsevier Inc.  

## 2019-09-15 NOTE — Progress Notes (Signed)
Established Patient Office Visit  Subjective:  Patient ID: Laurie Valenzuela, female    DOB: Apr 21, 1956  Age: 64 y.o. MRN: [id number]  CC:  Chief Complaint  Patient presents with  . Hypertension  . Hypothyroidism    HPI Laurie Valenzuela [name] presents for follow up of hypertension, hypothyroidism and intermittent numbness and tingling to her right 3rd, 4 & 5th toes, ball and lateral aspect of right foot. She states that she's compliant with her medications and continues to make healthy lifestyle modifications. Currently she states that the paresthesia is still the same and taking 100 mg of gabapentin bid moderately relieves symptoms. She states that she smokes 1 pack of cigarette daily and admits the desire to quit. She states that she has not had Mammogram nor Pap smear done. She denies chest pain, palpitation, light headedness, hematuria, hematochezia and active bleeding. Overall, she states that she's doing well and offers no further complaint.  Past Medical History:  Diagnosis Date  . HTN (hypertension)   . Hyperlipidemia   . Hypothyroidism   . Stroke [facility])    2017, 08/2018    Past Surgical History:  Procedure Laterality Date  . CESAREAN SECTION    . TIBIA IM NAIL INSERTION Right 09/03/2018   Procedure: INTRAMEDULLARY (IM) NAIL TIBIAL;  Surgeon: Myrene Galas, MD;  Location: [facility] OR;  Service: Orthopedics;  Laterality: Right;    Family History  Problem Relation Age of Onset  . Depression Father     Social History   Socioeconomic History  . Marital status: Single    Spouse name: Not on file  . Number of children: Not on file  . Years of education: Not on file  . Highest education level: Bachelor's degree (e.g., BA, AB, BS)  Occupational History    Comment: na  Tobacco Use  . Smoking status: Current Every Day Smoker    Packs/day: 0.50    Years: 40.00    Pack years: 20.00    Types: Cigarettes  . Smokeless tobacco: Never Used  Substance and  Sexual Activity  . Alcohol use: Yes    Alcohol/week: 2.0 standard drinks    Types: 2 Standard drinks or equivalent per week  . Drug use: Never  . Sexual activity: Not on file  Other Topics Concern  . Not on file  Social History Narrative   Lives alone   Caffeine 1-2 day   Social Determinants of Health   Financial Resource Strain:   . Difficulty of Paying Living Expenses:   Food Insecurity:   . Worried About Programme researcher, broadcasting/film/video in the Last Year:   . Barista in the Last Year:   Transportation Needs:   . Freight forwarder (Medical):   Marland Kitchen Lack of Transportation (Non-Medical):   Physical Activity:   . Days of Exercise per Week:   . Minutes of Exercise per Session:   Stress:   . Feeling of Stress :   Social Connections:   . Frequency of Communication with Friends and Family:   . Frequency of Social Gatherings with Friends and Family:   . Attends Religious Services:   . Active Member of Clubs or Organizations:   . Attends Banker Meetings:   Marland Kitchen Marital Status:   Intimate Partner Violence:   . Fear of Current or Ex-Partner:   . Emotionally Abused:   Marland Kitchen Physically Abused:   . Sexually Abused:     Outpatient Medications Prior to Visit  Medication Sig  Dispense Refill  . Multiple Vitamins-Minerals (MULTIVITAMIN WITH MINERALS) tablet Take 1 tablet by mouth daily.    Marland Kitchen atorvastatin (LIPITOR) 40 MG tablet Take 1 tablet (40 mg total) by mouth daily at 6 PM. 90 tablet 1  . clopidogrel (PLAVIX) 75 MG tablet Take 1 tablet (75 mg total) by mouth daily. 90 tablet 1  . gabapentin (NEURONTIN) 100 MG capsule Take 1 capsule (100 mg total) by mouth 2 (two) times daily. (Patient taking differently: Take 100 mg by mouth daily. ) 60 capsule 1  . levothyroxine (SYNTHROID) 175 MCG tablet Take 1 tablet (175 mcg total) by mouth daily. 90 tablet 1  . lisinopril (ZESTRIL) 10 MG tablet Take 1 tablet (10 mg total) by mouth daily. 90 tablet 1   No facility-administered medications  prior to visit.    Allergies  Allergen Reactions  . Codeine     ROS Review of Systems  Constitutional: Negative.   Eyes: Negative.   Respiratory: Negative.   Cardiovascular: Negative.   Gastrointestinal: Negative.   Skin: Negative.   Neurological: Positive for numbness (paresthesia to fingers).  Hematological: Negative.   Psychiatric/Behavioral: Negative.       Objective:    Physical Exam  Constitutional: She is oriented to person, place, and time. She appears well-developed.  HENT:  Head: Normocephalic and atraumatic.  Eyes: Pupils are equal, round, and reactive to light. EOM are normal.  Cardiovascular: Normal rate and regular rhythm.  Pulmonary/Chest: Effort normal and breath sounds normal.  Neurological: She is alert and oriented to person, place, and time.  Psychiatric: She has a normal mood and affect. Her behavior is normal. Judgment and thought content normal.    BP 124/84 (BP Location: Left Arm, Patient Position: Sitting)   Pulse 60   Ht 5\' 10"  (1.778 m)   Wt 215 lb 3.2 oz (97.6 kg)   SpO2 99%   BMI 30.88 kg/m  Wt Readings from Last 3 Encounters:  09/15/19 215 lb 3.2 oz (97.6 kg)  06/17/19 211 lb (95.7 kg)  05/18/19 206 lb (93.4 kg)   She gained 4 pounds in 4 weeks and was encouraged to continue on her weight loss regimen.  Health Maintenance Due  Topic Date Due  . Hepatitis C Screening  Never done  . TETANUS/TDAP  Never done  . PAP SMEAR-Modifier  Never done  . MAMMOGRAM  Never done    There are no preventive care reminders to display for this patient.  Lab Results  Component Value Date   TSH 0.918 04/21/2019   Lab Results  Component Value Date   WBC 10.2 09/05/2018   HGB 11.2 (L) 09/05/2018   HCT 34.1 (L) 09/05/2018   MCV 95.3 09/05/2018   PLT 230 09/05/2018   Lab Results  Component Value Date   NA 142 02/24/2019   K 4.2 02/24/2019   CO2 23 02/24/2019   GLUCOSE 92 02/24/2019   BUN 16 02/24/2019   CREATININE 0.79 02/24/2019    BILITOT 0.3 02/24/2019   ALKPHOS 106 02/24/2019   AST 15 02/24/2019   ALT 16 02/24/2019   PROT 7.3 02/24/2019   ALBUMIN 4.6 02/24/2019   CALCIUM 9.5 02/24/2019   ANIONGAP 10 09/05/2018   Lab Results  Component Value Date   CHOL 156 02/24/2019   Lab Results  Component Value Date   HDL 46 02/24/2019   Lab Results  Component Value Date   LDLCALC 89 02/24/2019   Lab Results  Component Value Date   TRIG 103 02/24/2019  Lab Results  Component Value Date   CHOLHDL 3.4 02/24/2019   Lab Results  Component Value Date   HGBA1C 5.4 08/28/2018      Assessment & Plan:    1. Hyperlipidemia, unspecified hyperlipidemia type - She will continue on current treatment regimen. -Low fat Diet, like low fat dairy products eg skimmed milk -Avoid any fried food -Regular exercise/walk -Goal for Total Cholesterol is less than 200 -Goal for bad cholesterol LDL is less than 70 -Goal for Good cholesterol HDL is more than 45 -Goal for Triglyceride is less than 150 - atorvastatin (LIPITOR) 40 MG tablet; Take 1 tablet (40 mg total) by mouth daily at 6 PM.  Dispense: 90 tablet; Refill: 1  2. Cerebrovascular accident (CVA) due to occlusion of cerebral artery ([place]) - She will continue on current treatment regimen, and was advised to go to the ED for active bleeding. - clopidogrel (PLAVIX) 75 MG tablet; Take 1 tablet (75 mg total) by mouth daily.  Dispense: 90 tablet; Refill: 1  3. Numbness and tingling - She will continue on current medication and was advised to notify clinic for worsening symptoms. - gabapentin (NEURONTIN) 100 MG capsule; Take 1 capsule (100 mg total) by mouth 2 (two) times daily.  Dispense: 60 capsule; Refill: 1  4. Hypothyroidism, postablative - She will continue on current treatment regimen. - levothyroxine (SYNTHROID) 175 MCG tablet; Take 1 tablet (175 mcg total) by mouth daily.  Dispense: 90 tablet; Refill: 1  5. Essential (primary) hypertension - Her blood pressure  is under control and was advised to continue on current treatment regimen and to continue on DASH diet and exercise as tolerated. - lisinopril (ZESTRIL) 10 MG tablet; Take 1 tablet (10 mg total) by mouth daily.  Dispense: 90 tablet; Refill: 1  6. Health care maintenance  - Ambulatory referral to Hematology / Oncology for Mammogram and Pap smear.    Follow-up: Return in about 13 weeks (around 12/15/2019), or if symptoms worsen or fail to improve.    Shemicka Cohrs Jerold Coombe, NP

## 2019-10-07 ENCOUNTER — Ambulatory Visit: Payer: Self-pay | Attending: Internal Medicine

## 2019-10-07 DIAGNOSIS — Z23 Encounter for immunization: Secondary | ICD-10-CM

## 2019-10-07 NOTE — Progress Notes (Signed)
   Covid-19 Vaccination Clinic  Name:  Laurie Valenzuela    MRN: [id number] DOB: 1955/11/02  10/07/2019  Ms. Laurie Valenzuela was observed post Covid-19 immunization for 15 minutes without incident. She was provided with Vaccine Information Sheet and instruction to access the V-Safe system.   Ms. Laurie Valenzuela was instructed to call 911 with any severe reactions post vaccine: Marland Kitchen Difficulty breathing  . Swelling of face and throat  . A fast heartbeat  . A bad rash all over body  . Dizziness and weakness   Immunizations Administered    Name Date Dose VIS Date Route   Pfizer COVID-19 Vaccine 10/07/2019 10:13 AM 0.3 mL 06/11/2019 Intramuscular   Manufacturer: [employer], [employer]   Lot: [id number]   NDC: [address]-6

## 2019-10-18 ENCOUNTER — Telehealth: Payer: Self-pay | Admitting: Pharmacy Technician

## 2019-10-18 NOTE — Telephone Encounter (Signed)
Received updated proof of income.  Patient eligible to receive medication assistance at [facility] until March 01, 2020.  Patient will be eligible to sign-up for a Medicare Part D plan by 03/01/20.  Sherilyn Dacosta Care Manager [facility]

## 2019-11-02 ENCOUNTER — Ambulatory Visit: Payer: Self-pay | Attending: Internal Medicine

## 2019-11-02 DIAGNOSIS — Z23 Encounter for immunization: Secondary | ICD-10-CM

## 2019-11-02 NOTE — Progress Notes (Signed)
   Covid-19 Vaccination Clinic  Name:  Adara Kittle    MRN: [id number] DOB: 02-Jan-1956  11/02/2019  Ms. Zenaida Niece [place] was observed post Covid-19 immunization for 15 minutes without incident. She was provided with Vaccine Information Sheet and instruction to access the V-Safe system.   Ms. Aleena Kirkeby was instructed to call 911 with any severe reactions post vaccine: Marland Kitchen Difficulty breathing  . Swelling of face and throat  . A fast heartbeat  . A bad rash all over body  . Dizziness and weakness   Immunizations Administered    Name Date Dose VIS Date Route   Pfizer COVID-19 Vaccine 11/02/2019  9:18 AM 0.3 mL 08/25/2018 Intramuscular   Manufacturer: [employer], [employer]   Lot: [id number]   NDC: [address]-0

## 2019-12-14 ENCOUNTER — Other Ambulatory Visit: Payer: Self-pay | Admitting: Gerontology

## 2019-12-15 ENCOUNTER — Other Ambulatory Visit: Payer: Self-pay

## 2019-12-15 ENCOUNTER — Encounter: Payer: Self-pay | Admitting: Gerontology

## 2019-12-15 ENCOUNTER — Ambulatory Visit: Payer: Self-pay | Admitting: Gerontology

## 2019-12-15 VITALS — BP 125/82 | HR 55 | Ht 70.0 in | Wt 209.0 lb

## 2019-12-15 DIAGNOSIS — I10 Essential (primary) hypertension: Secondary | ICD-10-CM

## 2019-12-15 DIAGNOSIS — E785 Hyperlipidemia, unspecified: Secondary | ICD-10-CM

## 2019-12-15 DIAGNOSIS — Z0000 Encounter for general adult medical examination without abnormal findings: Secondary | ICD-10-CM | POA: Insufficient documentation

## 2019-12-15 DIAGNOSIS — R202 Paresthesia of skin: Secondary | ICD-10-CM

## 2019-12-15 DIAGNOSIS — R300 Dysuria: Secondary | ICD-10-CM | POA: Insufficient documentation

## 2019-12-15 DIAGNOSIS — E890 Postprocedural hypothyroidism: Secondary | ICD-10-CM

## 2019-12-15 DIAGNOSIS — N898 Other specified noninflammatory disorders of vagina: Secondary | ICD-10-CM | POA: Insufficient documentation

## 2019-12-15 DIAGNOSIS — I6350 Cerebral infarction due to unspecified occlusion or stenosis of unspecified cerebral artery: Secondary | ICD-10-CM

## 2019-12-15 MED ORDER — ATORVASTATIN CALCIUM 40 MG PO TABS: 40.0000 mg | ORAL_TABLET | Freq: Every day | ORAL | 1 refills | Status: DC

## 2019-12-15 MED ORDER — LISINOPRIL 10 MG PO TABS: 10.0000 mg | ORAL_TABLET | Freq: Every day | ORAL | 1 refills | Status: DC

## 2019-12-15 MED ORDER — CLOTRIMAZOLE 1 % EX CREA: 1.0000 "application " | TOPICAL_CREAM | Freq: Two times a day (BID) | CUTANEOUS | 0 refills | Status: AC

## 2019-12-15 MED ORDER — CLOPIDOGREL BISULFATE 75 MG PO TABS: 75.0000 mg | ORAL_TABLET | Freq: Every day | ORAL | 1 refills | Status: DC

## 2019-12-15 MED ORDER — GABAPENTIN 100 MG PO CAPS: 100.0000 mg | ORAL_CAPSULE | Freq: Two times a day (BID) | ORAL | 2 refills | Status: DC

## 2019-12-15 MED ORDER — LEVOTHYROXINE SODIUM 175 MCG PO TABS: 175.0000 ug | ORAL_TABLET | Freq: Every day | ORAL | 1 refills | Status: DC

## 2019-12-15 NOTE — Progress Notes (Signed)
[facility]  Established Patient Office Visit  Subjective:  Patient ID: Laurie Valenzuela, female    DOB: Apr 04, 1956  Age: 64 y.o. MRN: [id number]  CC:  Chief Complaint  Patient presents with   Hypertension   Hypothyroidism   Vaginitis    itching; ongoing for about 1.5 weeks    HPI Laurie Valenzuela presents for  follow up of hypertension, hypothyroidism and intermittent numbness and tingling toherright 3rd, 4 &5th toes, ball and lateral aspect of right foot. She states that she's compliant with her medications and continues to make healthy lifestyle modifications. She states that the paresthesia is still the same and taking 100 mg of gabapentin bid moderately relieves symptoms. She states that she smokes 1 pack of cigarette daily and admits the desire to quit. Currently, she states that she has been experiencing vaginal itch for the past one week. She admits vaginal dryness,dysuria, urinary frequency, urgency, but denies flank nor pelvic pain and vaginal discharge. She states that she's sexually active and uses protection. Overall, she states that she's doing well and offers no further complaint.  Past Medical History:  Diagnosis Date   HTN (hypertension)    Hyperlipidemia    Hypothyroidism    Stroke [facility])    2017, 08/2018    Past Surgical History:  Procedure Laterality Date   CESAREAN SECTION     TIBIA IM NAIL INSERTION Right 09/03/2018   Procedure: INTRAMEDULLARY (IM) NAIL TIBIAL;  Surgeon: Myrene Galas, MD;  Location: [facility] OR;  Service: Orthopedics;  Laterality: Right;    Family History  Problem Relation Age of Onset   Depression Father     Social History   Socioeconomic History   Marital status: Single    Spouse name: Not on file   Number of children: Not on file   Years of education: Not on file   Highest education level: Bachelor's degree (e.g., BA, AB, BS)  Occupational History    Comment: na  Tobacco Use   Smoking  status: Current Every Day Smoker    Packs/day: 0.50    Years: 40.00    Pack years: 20.00    Types: Cigarettes   Smokeless tobacco: Never Used  Vaping Use   Vaping Use: Never used  Substance and Sexual Activity   Alcohol use: Yes    Alcohol/week: 2.0 standard drinks    Types: 2 Standard drinks or equivalent per week   Drug use: Never   Sexual activity: Not on file  Other Topics Concern   Not on file  Social History Narrative   Lives alone   Caffeine 1-2 day   Social Determinants of Health   Financial Resource Strain: Medium Risk   Difficulty of Paying Living Expenses: Somewhat hard  Food Insecurity: No Food Insecurity   Worried About Programme researcher, broadcasting/film/video in the Last Year: Never true   Ran Out of Food in the Last Year: Never true  Transportation Needs: No Transportation Needs   Lack of Transportation (Medical): No   Lack of Transportation (Non-Medical): No  Physical Activity: Sufficiently Active   Days of Exercise per Week: 7 days   Minutes of Exercise per Session: 40 min  Stress:    Feeling of Stress :   Social Connections: Socially Isolated   Frequency of Communication with Friends and Family: Never   Frequency of Social Gatherings with Friends and Family: Never   Attends Religious Services: Never   Database administrator or Organizations: No   Attends Club or  Organization Meetings: Never   Marital Status: Never married  Catering manager Violence: Not At Risk   Fear of Current or Ex-Partner: No   Emotionally Abused: No   Physically Abused: No   Sexually Abused: No    Outpatient Medications Prior to Visit  Medication Sig Dispense Refill   aspirin EC 81 MG tablet Take 81 mg by mouth daily. Swallow whole.     Multiple Vitamins-Minerals (MULTIVITAMIN WITH MINERALS) tablet Take 1 tablet by mouth daily.     atorvastatin (LIPITOR) 40 MG tablet Take 1 tablet (40 mg total) by mouth daily at 6 PM. 90 tablet 1   clopidogrel (PLAVIX) 75 MG tablet  Take 1 tablet (75 mg total) by mouth daily. 90 tablet 1   gabapentin (NEURONTIN) 100 MG capsule Take 1 capsule (100 mg total) by mouth 2 (two) times daily. 60 capsule 1   levothyroxine (SYNTHROID) 175 MCG tablet Take 1 tablet (175 mcg total) by mouth daily. 90 tablet 1   lisinopril (ZESTRIL) 10 MG tablet Take 1 tablet (10 mg total) by mouth daily. 90 tablet 1   No facility-administered medications prior to visit.    Allergies  Allergen Reactions   Codeine     ROS Review of Systems  Constitutional: Negative.   Eyes: Negative.   Respiratory: Negative.   Cardiovascular: Negative.   Genitourinary: Positive for dysuria, frequency and urgency. Negative for hematuria and vaginal discharge.  Neurological: Positive for numbness (chronic to toes).      Objective:    Physical Exam Constitutional:      Appearance: Normal appearance.  HENT:     Head: Normocephalic and atraumatic.  Eyes:     Extraocular Movements: Extraocular movements intact.     Pupils: Pupils are equal, round, and reactive to light.  Cardiovascular:     Rate and Rhythm: Bradycardia present.     Pulses: Normal pulses.     Heart sounds: Normal heart sounds. No murmur heard.  No friction rub.  Pulmonary:     Effort: Pulmonary effort is normal.     Breath sounds: Normal breath sounds.  Genitourinary:    Comments: Deferred per patient Skin:    General: Skin is warm.  Neurological:     General: No focal deficit present.     Mental Status: She is alert and oriented to person, place, and time. Mental status is at baseline.  Psychiatric:        Mood and Affect: Mood normal.        Behavior: Behavior normal.        Thought Content: Thought content normal.        Judgment: Judgment normal.     BP 125/82 (BP Location: Left Arm, Patient Position: Sitting)    Pulse (!) 55    Ht 5\' 10"  (1.778 m)    Wt 209 lb (94.8 kg)    SpO2 99%    BMI 29.99 kg/m  Wt Readings from Last 3 Encounters:  12/15/19 209 lb (94.8 kg)   09/15/19 215 lb 3.2 oz (97.6 kg)  06/17/19 211 lb (95.7 kg)  She lost 6 pounds in 3 months, she was encouraged to continue her weight loss regime.   Health Maintenance Due  Topic Date Due   Hepatitis C Screening  Never done   TETANUS/TDAP  Never done   PAP SMEAR-Modifier  Never done   MAMMOGRAM  Never done    There are no preventive care reminders to display for this patient.  Lab Results  Component  Value Date   TSH 0.918 04/21/2019   Lab Results  Component Value Date   WBC 10.2 09/05/2018   HGB 11.2 (L) 09/05/2018   HCT 34.1 (L) 09/05/2018   MCV 95.3 09/05/2018   PLT 230 09/05/2018   Lab Results  Component Value Date   NA 142 02/24/2019   K 4.2 02/24/2019   CO2 23 02/24/2019   GLUCOSE 92 02/24/2019   BUN 16 02/24/2019   CREATININE 0.79 02/24/2019   BILITOT 0.3 02/24/2019   ALKPHOS 106 02/24/2019   AST 15 02/24/2019   ALT 16 02/24/2019   PROT 7.3 02/24/2019   ALBUMIN 4.6 02/24/2019   CALCIUM 9.5 02/24/2019   ANIONGAP 10 09/05/2018   Lab Results  Component Value Date   CHOL 156 02/24/2019   Lab Results  Component Value Date   HDL 46 02/24/2019   Lab Results  Component Value Date   LDLCALC 89 02/24/2019   Lab Results  Component Value Date   TRIG 103 02/24/2019   Lab Results  Component Value Date   CHOLHDL 3.4 02/24/2019   Lab Results  Component Value Date   HGBA1C 5.4 08/28/2018      Assessment & Plan:    1. Hyperlipidemia, unspecified hyperlipidemia type -She will continue on current treatment regimen and was advised to continue on low fat/ cholesterol diet. - atorvastatin (LIPITOR) 40 MG tablet; Take 1 tablet (40 mg total) by mouth daily at 6 PM.  Dispense: 90 tablet; Refill: 1  2. Cerebrovascular accident (CVA) due to occlusion of cerebral artery ([place]) - She will continue on current treatment regimen and was advised to go to the ED for active bleeding. - clopidogrel (PLAVIX) 75 MG tablet; Take 1 tablet (75 mg total) by mouth daily.   Dispense: 90 tablet; Refill: 1  3. Numbness and tingling - She will continue on current treatment regimen and notify clinic for worsening symptoms. - gabapentin (NEURONTIN) 100 MG capsule; Take 1 capsule (100 mg total) by mouth 2 (two) times daily.  Dispense: 60 capsule; Refill: 2  4. Hypothyroidism, postablative - She was advised to continue on current treatment  - levothyroxine (SYNTHROID) 175 MCG tablet; Take 1 tablet (175 mcg total) by mouth daily.  Dispense: 90 tablet; Refill: 1  5. Essential (primary) hypertension - Her blood pressure is under control and she will continue on current treatment regimen, her heart rate is 55 b.p.m and she states that she has chronic bradycardia, denies dizziness and fatigue, stating it's a chronic condition. She was advised to notify clinic with any symptoms. - lisinopril (ZESTRIL) 10 MG tablet; Take 1 tablet (10 mg total) by mouth daily.  Dispense: 90 tablet; Refill: 1  6. Dysuria - Urine will be checked to rule out UTI. - Urinalysis; Future - UA/M w/rflx Culture, Routine; Future - UA/M w/rflx Culture, Routine - Urinalysis  7. Itching in the vaginal area - She was advised to call and schedule an appointment with [facility] for Vaginal Swab since test couldn't be done at the clinic. She will continue on Clotrimazole and was advised to notify clinic with worsening symptoms. - clotrimazole (LOTRIMIN) 1 % cream; Apply 1 application topically 2 (two) times daily.  Dispense: 30 g; Refill: 0  8. Health care maintenance  - Ambulatory referral to Hematology / Oncology for Mammogram and Pap smear.    Follow-up: Return in about 7 weeks (around 02/02/2020), or if symptoms worsen or fail to improve.    Evon Lopezperez Jerold Coombe, NP

## 2019-12-15 NOTE — Patient Instructions (Signed)
DASH Eating Plan DASH stands for "Dietary Approaches to Stop Hypertension." The DASH eating plan is a healthy eating plan that has been shown to reduce high blood pressure (hypertension). It may also reduce your risk for type 2 diabetes, heart disease, and stroke. The DASH eating plan may also help with weight loss. What are tips for following this plan?  General guidelines  Avoid eating more than 2,300 mg (milligrams) of salt (sodium) a day. If you have hypertension, you may need to reduce your sodium intake to 1,500 mg a day.  Limit alcohol intake to no more than 1 drink a day for nonpregnant women and 2 drinks a day for men. One drink equals 12 oz of beer, 5 oz of wine, or 1 oz of hard liquor.  Work with your health care provider to maintain a healthy body weight or to lose weight. Ask what an ideal weight is for you.  Get at least 30 minutes of exercise that causes your heart to beat faster (aerobic exercise) most days of the week. Activities may include walking, swimming, or biking.  Work with your health care provider or diet and nutrition specialist (dietitian) to adjust your eating plan to your individual calorie needs. Reading food labels   Check food labels for the amount of sodium per serving. Choose foods with less than 5 percent of the Daily Value of sodium. Generally, foods with less than 300 mg of sodium per serving fit into this eating plan.  To find whole grains, look for the word "whole" as the first word in the ingredient list. Shopping  Buy products labeled as "low-sodium" or "no salt added."  Buy fresh foods. Avoid canned foods and premade or frozen meals. Cooking  Avoid adding salt when cooking. Use salt-free seasonings or herbs instead of table salt or sea salt. Check with your health care provider or pharmacist before using salt substitutes.  Do not fry foods. Cook foods using healthy methods such as baking, boiling, grilling, and broiling instead.  Cook with  heart-healthy oils, such as olive, canola, soybean, or sunflower oil. Meal planning  Eat a balanced diet that includes: ? 5 or more servings of fruits and vegetables each day. At each meal, try to fill half of your plate with fruits and vegetables. ? Up to 6-8 servings of whole grains each day. ? Less than 6 oz of lean meat, poultry, or fish each day. A 3-oz serving of meat is about the same size as a deck of cards. One egg equals 1 oz. ? 2 servings of low-fat dairy each day. ? A serving of nuts, seeds, or beans 5 times each week. ? Heart-healthy fats. Healthy fats called Omega-3 fatty acids are found in foods such as flaxseeds and coldwater fish, like sardines, salmon, and mackerel.  Limit how much you eat of the following: ? Canned or prepackaged foods. ? Food that is high in trans fat, such as fried foods. ? Food that is high in saturated fat, such as fatty meat. ? Sweets, desserts, sugary drinks, and other foods with added sugar. ? Full-fat dairy products.  Do not salt foods before eating.  Try to eat at least 2 vegetarian meals each week.  Eat more home-cooked food and less restaurant, buffet, and fast food.  When eating at a restaurant, ask that your food be prepared with less salt or no salt, if possible. What foods are recommended? The items listed may not be a complete list. Talk with your dietitian about   what dietary choices are best for you. Grains Whole-grain or whole-wheat bread. Whole-grain or whole-wheat pasta. Brown rice. Oatmeal. Quinoa. Bulgur. Whole-grain and low-sodium cereals. Pita bread. Low-fat, low-sodium crackers. Whole-wheat flour tortillas. Vegetables Fresh or frozen vegetables (raw, steamed, roasted, or grilled). Low-sodium or reduced-sodium tomato and vegetable juice. Low-sodium or reduced-sodium tomato sauce and tomato paste. Low-sodium or reduced-sodium canned vegetables. Fruits All fresh, dried, or frozen fruit. Canned fruit in natural juice (without  added sugar). Meat and other protein foods Skinless chicken or turkey. Ground chicken or turkey. Pork with fat trimmed off. Fish and seafood. Egg whites. Dried beans, peas, or lentils. Unsalted nuts, nut butters, and seeds. Unsalted canned beans. Lean cuts of beef with fat trimmed off. Low-sodium, lean deli meat. Dairy Low-fat (1%) or fat-free (skim) milk. Fat-free, low-fat, or reduced-fat cheeses. Nonfat, low-sodium ricotta or cottage cheese. Low-fat or nonfat yogurt. Low-fat, low-sodium cheese. Fats and oils Soft margarine without trans fats. Vegetable oil. Low-fat, reduced-fat, or light mayonnaise and salad dressings (reduced-sodium). Canola, safflower, olive, soybean, and sunflower oils. Avocado. Seasoning and other foods Herbs. Spices. Seasoning mixes without salt. Unsalted popcorn and pretzels. Fat-free sweets. What foods are not recommended? The items listed may not be a complete list. Talk with your dietitian about what dietary choices are best for you. Grains Baked goods made with fat, such as croissants, muffins, or some breads. Dry pasta or rice meal packs. Vegetables Creamed or fried vegetables. Vegetables in a cheese sauce. Regular canned vegetables (not low-sodium or reduced-sodium). Regular canned tomato sauce and paste (not low-sodium or reduced-sodium). Regular tomato and vegetable juice (not low-sodium or reduced-sodium). Pickles. Olives. Fruits Canned fruit in a light or heavy syrup. Fried fruit. Fruit in cream or butter sauce. Meat and other protein foods Fatty cuts of meat. Ribs. Fried meat. Bacon. Sausage. Bologna and other processed lunch meats. Salami. Fatback. Hotdogs. Bratwurst. Salted nuts and seeds. Canned beans with added salt. Canned or smoked fish. Whole eggs or egg yolks. Chicken or turkey with skin. Dairy Whole or 2% milk, cream, and half-and-half. Whole or full-fat cream cheese. Whole-fat or sweetened yogurt. Full-fat cheese. Nondairy creamers. Whipped toppings.  Processed cheese and cheese spreads. Fats and oils Butter. Stick margarine. Lard. Shortening. Ghee. Bacon fat. Tropical oils, such as coconut, palm kernel, or palm oil. Seasoning and other foods Salted popcorn and pretzels. Onion salt, garlic salt, seasoned salt, table salt, and sea salt. Worcestershire sauce. Tartar sauce. Barbecue sauce. Teriyaki sauce. Soy sauce, including reduced-sodium. Steak sauce. Canned and packaged gravies. Fish sauce. Oyster sauce. Cocktail sauce. Horseradish that you find on the shelf. Ketchup. Mustard. Meat flavorings and tenderizers. Bouillon cubes. Hot sauce and Tabasco sauce. Premade or packaged marinades. Premade or packaged taco seasonings. Relishes. Regular salad dressings. Where to find more information:  National Heart, Lung, and Blood Institute: www.nhlbi.nih.gov  American Heart Association: www.heart.org Summary  The DASH eating plan is a healthy eating plan that has been shown to reduce high blood pressure (hypertension). It may also reduce your risk for type 2 diabetes, heart disease, and stroke.  With the DASH eating plan, you should limit salt (sodium) intake to 2,300 mg a day. If you have hypertension, you may need to reduce your sodium intake to 1,500 mg a day.  When on the DASH eating plan, aim to eat more fresh fruits and vegetables, whole grains, lean proteins, low-fat dairy, and heart-healthy fats.  Work with your health care provider or diet and nutrition specialist (dietitian) to adjust your eating plan to your   individual calorie needs. This information is not intended to replace advice given to you by your health care provider. Make sure you discuss any questions you have with your health care provider. Document Revised: 05/30/2017 Document Reviewed: 06/10/2016 Elsevier Patient Education  2020 [employer]  

## 2019-12-16 ENCOUNTER — Other Ambulatory Visit: Payer: Self-pay | Admitting: Gerontology

## 2019-12-16 DIAGNOSIS — N390 Urinary tract infection, site not specified: Secondary | ICD-10-CM

## 2019-12-16 MED ORDER — NITROFURANTOIN MONOHYD MACRO 100 MG PO CAPS: 100.0000 mg | ORAL_CAPSULE | Freq: Two times a day (BID) | ORAL | 0 refills | Status: DC

## 2019-12-18 LAB — UA/M W/RFLX CULTURE, ROUTINE
Bilirubin, UA: NEGATIVE
Glucose, UA: NEGATIVE
Ketones, UA: NEGATIVE
Nitrite, UA: POSITIVE — AB
RBC, UA: NEGATIVE
Specific Gravity, UA: 1.026 (ref 1.005–1.030)
Urobilinogen, Ur: 0.2 mg/dL (ref 0.2–1.0)
pH, UA: 6.5 (ref 5.0–7.5)

## 2019-12-18 LAB — MICROSCOPIC EXAMINATION
Casts: NONE SEEN /lpf
Epithelial Cells (non renal): >10 /hpf — AB (ref 0–10)

## 2019-12-18 LAB — URINE CULTURE, REFLEX

## 2020-02-02 ENCOUNTER — Encounter: Payer: Self-pay | Admitting: Gerontology

## 2020-02-02 ENCOUNTER — Other Ambulatory Visit: Payer: Self-pay

## 2020-02-02 ENCOUNTER — Ambulatory Visit: Payer: Self-pay | Admitting: Gerontology

## 2020-02-02 VITALS — BP 138/86 | HR 72 | Temp 97.8°F | Wt 213.1 lb

## 2020-02-02 DIAGNOSIS — N898 Other specified noninflammatory disorders of vagina: Secondary | ICD-10-CM

## 2020-02-02 DIAGNOSIS — E890 Postprocedural hypothyroidism: Secondary | ICD-10-CM

## 2020-02-02 DIAGNOSIS — Z0000 Encounter for general adult medical examination without abnormal findings: Secondary | ICD-10-CM

## 2020-02-02 DIAGNOSIS — I6350 Cerebral infarction due to unspecified occlusion or stenosis of unspecified cerebral artery: Secondary | ICD-10-CM

## 2020-02-02 DIAGNOSIS — R200 Anesthesia of skin: Secondary | ICD-10-CM

## 2020-02-02 DIAGNOSIS — E785 Hyperlipidemia, unspecified: Secondary | ICD-10-CM

## 2020-02-02 DIAGNOSIS — I10 Essential (primary) hypertension: Secondary | ICD-10-CM

## 2020-02-02 MED ORDER — CLOPIDOGREL BISULFATE 75 MG PO TABS: 75.0000 mg | ORAL_TABLET | Freq: Every day | ORAL | 1 refills | Status: AC

## 2020-02-02 MED ORDER — LISINOPRIL 10 MG PO TABS: 10.0000 mg | ORAL_TABLET | Freq: Every day | ORAL | 1 refills | Status: AC

## 2020-02-02 MED ORDER — LEVOTHYROXINE SODIUM 175 MCG PO TABS: 175.0000 ug | ORAL_TABLET | Freq: Every day | ORAL | 1 refills | Status: AC

## 2020-02-02 MED ORDER — ATORVASTATIN CALCIUM 40 MG PO TABS: 40.0000 mg | ORAL_TABLET | Freq: Every day | ORAL | 1 refills | Status: AC

## 2020-02-02 MED ORDER — GABAPENTIN 100 MG PO CAPS: 100.0000 mg | ORAL_CAPSULE | Freq: Two times a day (BID) | ORAL | 2 refills | Status: AC

## 2020-02-02 MED ORDER — MICONAZOLE NITRATE 2 % VA CREA: 1.0000 | TOPICAL_CREAM | Freq: Every day | VAGINAL | 0 refills | Status: AC

## 2020-02-02 NOTE — Patient Instructions (Signed)
Fat and Cholesterol Restricted Eating Plan Getting too much fat and cholesterol in your diet may cause health problems. Choosing the right foods helps keep your fat and cholesterol at normal levels. This can keep you from getting certain diseases. Your doctor may recommend an eating plan that includes:  Total fat: ______% or less of total calories a day.  Saturated fat: ______% or less of total calories a day.  Cholesterol: less than _________mg a day.  Fiber: ______g a day. What are tips for following this plan? Meal planning  At meals, divide your plate into four equal parts: ? Fill one-half of your plate with vegetables and green salads. ? Fill one-fourth of your plate with whole grains. ? Fill one-fourth of your plate with low-fat (lean) protein foods.  Eat fish that is high in omega-3 fats at least two times a week. This includes mackerel, tuna, sardines, and salmon.  Eat foods that are high in fiber, such as whole grains, beans, apples, broccoli, carrots, peas, and barley. General tips   Work with your doctor to lose weight if you need to.  Avoid: ? Foods with added sugar. ? Fried foods. ? Foods with partially hydrogenated oils.  Limit alcohol intake to no more than 1 drink a day for nonpregnant women and 2 drinks a day for men. One drink equals 12 oz of beer, 5 oz of wine, or 1 oz of hard liquor. Reading food labels  Check food labels for: ? Trans fats. ? Partially hydrogenated oils. ? Saturated fat (g) in each serving. ? Cholesterol (mg) in each serving. ? Fiber (g) in each serving.  Choose foods with healthy fats, such as: ? Monounsaturated fats. ? Polyunsaturated fats. ? Omega-3 fats.  Choose grain products that have whole grains. Look for the word "whole" as the first word in the ingredient list. Cooking  Cook foods using low-fat methods. These include baking, boiling, grilling, and broiling.  Eat more home-cooked foods. Eat at restaurants and buffets  less often.  Avoid cooking using saturated fats, such as butter, cream, palm oil, palm kernel oil, and coconut oil. Recommended foods  Fruits  All fresh, canned (in natural juice), or frozen fruits. Vegetables  Fresh or frozen vegetables (raw, steamed, roasted, or grilled). Green salads. Grains  Whole grains, such as whole wheat or whole grain breads, crackers, cereals, and pasta. Unsweetened oatmeal, bulgur, barley, quinoa, or brown rice. Corn or whole wheat flour tortillas. Meats and other protein foods  Ground beef (85% or leaner), grass-fed beef, or beef trimmed of fat. Skinless chicken or turkey. Ground chicken or turkey. Pork trimmed of fat. All fish and seafood. Egg whites. Dried beans, peas, or lentils. Unsalted nuts or seeds. Unsalted canned beans. Nut butters without added sugar or oil. Dairy  Low-fat or nonfat dairy products, such as skim or 1% milk, 2% or reduced-fat cheeses, low-fat and fat-free ricotta or cottage cheese, or plain low-fat and nonfat yogurt. Fats and oils  Tub margarine without trans fats. Light or reduced-fat mayonnaise and salad dressings. Avocado. Olive, canola, sesame, or safflower oils. The items listed above may not be a complete list of foods and beverages you can eat. Contact a dietitian for more information. Foods to avoid Fruits  Canned fruit in heavy syrup. Fruit in cream or butter sauce. Fried fruit. Vegetables  Vegetables cooked in cheese, cream, or butter sauce. Fried vegetables. Grains  White bread. White pasta. White rice. Cornbread. Bagels, pastries, and croissants. Crackers and snack foods that contain trans fat   and hydrogenated oils. Meats and other protein foods  Fatty cuts of meat. Ribs, chicken wings, bacon, sausage, bologna, salami, chitterlings, fatback, hot dogs, bratwurst, and packaged lunch meats. Liver and organ meats. Whole eggs and egg yolks. Chicken and turkey with skin. Fried meat. Dairy  Whole or 2% milk, cream,  half-and-half, and cream cheese. Whole milk cheeses. Whole-fat or sweetened yogurt. Full-fat cheeses. Nondairy creamers and whipped toppings. Processed cheese, cheese spreads, and cheese curds. Beverages  Alcohol. Sugar-sweetened drinks such as sodas, lemonade, and fruit drinks. Fats and oils  Butter, stick margarine, lard, shortening, ghee, or bacon fat. Coconut, palm kernel, and palm oils. Sweets and desserts  Corn syrup, sugars, honey, and molasses. Candy. Jam and jelly. Syrup. Sweetened cereals. Cookies, pies, cakes, donuts, muffins, and ice cream. The items listed above may not be a complete list of foods and beverages you should avoid. Contact a dietitian for more information. Summary  Choosing the right foods helps keep your fat and cholesterol at normal levels. This can keep you from getting certain diseases.  At meals, fill one-half of your plate with vegetables and green salads.  Eat high-fiber foods, like whole grains, beans, apples, carrots, peas, and barley.  Limit added sugar, saturated fats, alcohol, and fried foods. This information is not intended to replace advice given to you by your health care provider. Make sure you discuss any questions you have with your health care provider. Document Revised: 02/18/2018 Document Reviewed: 03/04/2017 Elsevier Patient Education  2020 Else[employer] Inc. DASH Eating Plan DASH stands for "Dietary Approaches to Stop Hypertension." The DASH eating plan is a healthy eating plan that has been shown to reduce high blood pressure (hypertension). It may also reduce your risk for type 2 diabetes, heart disease, and stroke. The DASH eating plan may also help with weight loss. What are tips for following this plan?  General guidelines  Avoid eating more than 2,300 mg (milligrams) of salt (sodium) a day. If you have hypertension, you may need to reduce your sodium intake to 1,500 mg a day.  Limit alcohol intake to no more than 1 drink a day for  nonpregnant women and 2 drinks a day for men. One drink equals 12 oz of beer, 5 oz of wine, or 1 oz of hard liquor.  Work with your health care provider to maintain a healthy body weight or to lose weight. Ask what an ideal weight is for you.  Get at least 30 minutes of exercise that causes your heart to beat faster (aerobic exercise) most days of the week. Activities may include walking, swimming, or biking.  Work with your health care provider or diet and nutrition specialist (dietitian) to adjust your eating plan to your individual calorie needs. Reading food labels   Check food labels for the amount of sodium per serving. Choose foods with less than 5 percent of the Daily Value of sodium. Generally, foods with less than 300 mg of sodium per serving fit into this eating plan.  To find whole grains, look for the word "whole" as the first word in the ingredient list. Shopping  Buy products labeled as "low-sodium" or "no salt added."  Buy fresh foods. Avoid canned foods and premade or frozen meals. Cooking  Avoid adding salt when cooking. Use salt-free seasonings or herbs instead of table salt or sea salt. Check with your health care provider or pharmacist before using salt substitutes.  Do not fry foods. Cook foods using healthy methods such as baking, boiling, grilling,   and broiling instead.  Cook with heart-healthy oils, such as olive, canola, soybean, or sunflower oil. Meal planning  Eat a balanced diet that includes: ? 5 or more servings of fruits and vegetables each day. At each meal, try to fill half of your plate with fruits and vegetables. ? Up to 6-8 servings of whole grains each day. ? Less than 6 oz of lean meat, poultry, or fish each day. A 3-oz serving of meat is about the same size as a deck of cards. One egg equals 1 oz. ? 2 servings of low-fat dairy each day. ? A serving of nuts, seeds, or beans 5 times each week. ? Heart-healthy fats. Healthy fats called Omega-3  fatty acids are found in foods such as flaxseeds and coldwater fish, like sardines, salmon, and mackerel.  Limit how much you eat of the following: ? Canned or prepackaged foods. ? Food that is high in trans fat, such as fried foods. ? Food that is high in saturated fat, such as fatty meat. ? Sweets, desserts, sugary drinks, and other foods with added sugar. ? Full-fat dairy products.  Do not salt foods before eating.  Try to eat at least 2 vegetarian meals each week.  Eat more home-cooked food and less restaurant, buffet, and fast food.  When eating at a restaurant, ask that your food be prepared with less salt or no salt, if possible. What foods are recommended? The items listed may not be a complete list. Talk with your dietitian about what dietary choices are best for you. Grains Whole-grain or whole-wheat bread. Whole-grain or whole-wheat pasta. Brown rice. Oatmeal. Quinoa. Bulgur. Whole-grain and low-sodium cereals. Pita bread. Low-fat, low-sodium crackers. Whole-wheat flour tortillas. Vegetables Fresh or frozen vegetables (raw, steamed, roasted, or grilled). Low-sodium or reduced-sodium tomato and vegetable juice. Low-sodium or reduced-sodium tomato sauce and tomato paste. Low-sodium or reduced-sodium canned vegetables. Fruits All fresh, dried, or frozen fruit. Canned fruit in natural juice (without added sugar). Meat and other protein foods Skinless chicken or turkey. Ground chicken or turkey. Pork with fat trimmed off. Fish and seafood. Egg whites. Dried beans, peas, or lentils. Unsalted nuts, nut butters, and seeds. Unsalted canned beans. Lean cuts of beef with fat trimmed off. Low-sodium, lean deli meat. Dairy Low-fat (1%) or fat-free (skim) milk. Fat-free, low-fat, or reduced-fat cheeses. Nonfat, low-sodium ricotta or cottage cheese. Low-fat or nonfat yogurt. Low-fat, low-sodium cheese. Fats and oils Soft margarine without trans fats. Vegetable oil. Low-fat, reduced-fat, or  light mayonnaise and salad dressings (reduced-sodium). Canola, safflower, olive, soybean, and sunflower oils. Avocado. Seasoning and other foods Herbs. Spices. Seasoning mixes without salt. Unsalted popcorn and pretzels. Fat-free sweets. What foods are not recommended? The items listed may not be a complete list. Talk with your dietitian about what dietary choices are best for you. Grains Baked goods made with fat, such as croissants, muffins, or some breads. Dry pasta or rice meal packs. Vegetables Creamed or fried vegetables. Vegetables in a cheese sauce. Regular canned vegetables (not low-sodium or reduced-sodium). Regular canned tomato sauce and paste (not low-sodium or reduced-sodium). Regular tomato and vegetable juice (not low-sodium or reduced-sodium). Pickles. Olives. Fruits Canned fruit in a light or heavy syrup. Fried fruit. Fruit in cream or butter sauce. Meat and other protein foods Fatty cuts of meat. Ribs. Fried meat. Bacon. Sausage. Bologna and other processed lunch meats. Salami. Fatback. Hotdogs. Bratwurst. Salted nuts and seeds. Canned beans with added salt. Canned or smoked fish. Whole eggs or egg yolks. Chicken or turkey   with skin. Dairy Whole or 2% milk, cream, and half-and-half. Whole or full-fat cream cheese. Whole-fat or sweetened yogurt. Full-fat cheese. Nondairy creamers. Whipped toppings. Processed cheese and cheese spreads. Fats and oils Butter. Stick margarine. Lard. Shortening. Ghee. Bacon fat. Tropical oils, such as coconut, palm kernel, or palm oil. Seasoning and other foods Salted popcorn and pretzels. Onion salt, garlic salt, seasoned salt, table salt, and sea salt. Worcestershire sauce. Tartar sauce. Barbecue sauce. Teriyaki sauce. Soy sauce, including reduced-sodium. Steak sauce. Canned and packaged gravies. Fish sauce. Oyster sauce. Cocktail sauce. Horseradish that you find on the shelf. Ketchup. Mustard. Meat flavorings and tenderizers. Bouillon cubes. Hot  sauce and Tabasco sauce. Premade or packaged marinades. Premade or packaged taco seasonings. Relishes. Regular salad dressings. Where to find more information:  National Heart, Lung, and Blood Institute: www.nhlbi.nih.gov  American Heart Association: www.heart.org Summary  The DASH eating plan is a healthy eating plan that has been shown to reduce high blood pressure (hypertension). It may also reduce your risk for type 2 diabetes, heart disease, and stroke.  With the DASH eating plan, you should limit salt (sodium) intake to 2,300 mg a day. If you have hypertension, you may need to reduce your sodium intake to 1,500 mg a day.  When on the DASH eating plan, aim to eat more fresh fruits and vegetables, whole grains, lean proteins, low-fat dairy, and heart-healthy fats.  Work with your health care provider or diet and nutrition specialist (dietitian) to adjust your eating plan to your individual calorie needs. This information is not intended to replace advice given to you by your health care provider. Make sure you discuss any questions you have with your health care provider. Document Revised: 05/30/2017 Document Reviewed: 06/10/2016 Elsevier Patient Education  2020 Elsevier Inc.  

## 2020-02-02 NOTE — Progress Notes (Signed)
Established Patient Office Visit  Subjective:  Patient ID: Laurie Valenzuela, female    DOB: 06-Sep-1955  Age: 64 y.o. MRN: [id number]  CC: No chief complaint on file.   HPI Laurie Valenzuela presents for follow up of Hypertension, Hyperlipidemia, Hypothyroidism and medication refill. She states that she's compliant with her medications, adheres to DASH diet and exercises as tolerated. She states that she checks her blood pressure at home and it's usually in the 130's/80's.  She continues to smoke one pack of cigarette daily and admits the desire to quit. She denies dysuria, vaginal discharge, flank and pelvic pain, but states that she continues to experience intermittent vaginal itch. Overall, she states that she's doing well and offers no further complaint.  Past Medical History:  Diagnosis Date   HTN (hypertension)    Hyperlipidemia    Hypothyroidism    Stroke [facility] Valenzuela)    2017, 08/2018    Past Surgical History:  Procedure Laterality Date   CESAREAN SECTION     TIBIA IM NAIL INSERTION Right 09/03/2018   Procedure: INTRAMEDULLARY (IM) NAIL TIBIAL;  Surgeon: Myrene Galas, MD;  Location: [facility] OR;  Service: Orthopedics;  Laterality: Right;    Family History  Problem Relation Age of Onset   Depression Father     Social History   Socioeconomic History   Marital status: Single    Spouse name: Not on file   Number of children: Not on file   Years of education: Not on file   Highest education level: Bachelor's degree (e.g., BA, AB, BS)  Occupational History    Comment: na  Tobacco Use   Smoking status: Current Every Day Smoker    Packs/day: 0.50    Years: 40.00    Pack years: 20.00    Types: Cigarettes   Smokeless tobacco: Never Used  Vaping Use   Vaping Use: Never used  Substance and Sexual Activity   Alcohol use: Yes    Alcohol/week: 2.0 standard drinks    Types: 2 Standard drinks or equivalent per week   Drug use: Never    Sexual activity: Not on file  Other Topics Concern   Not on file  Social History Narrative   Lives alone   Caffeine 1-2 day   Social Determinants of Health   Financial Resource Strain: Medium Risk   Difficulty of Paying Living Expenses: Somewhat hard  Food Insecurity: No Food Insecurity   Worried About Programme researcher, broadcasting/film/video in the Last Year: Never true   Ran Out of Food in the Last Year: Never true  Transportation Needs: No Transportation Needs   Lack of Transportation (Medical): No   Lack of Transportation (Non-Medical): No  Physical Activity: Sufficiently Active   Days of Exercise per Week: 7 days   Minutes of Exercise per Session: 40 min  Stress:    Feeling of Stress :   Social Connections: Socially Isolated   Frequency of Communication with Friends and Family: Never   Frequency of Social Gatherings with Friends and Family: Never   Attends Religious Services: Never   Database administrator or Organizations: No   Attends Engineer, structural: Never   Marital Status: Never married  Catering manager Violence: Not At Risk   Fear of Current or Ex-Partner: No   Emotionally Abused: No   Physically Abused: No   Sexually Abused: No    Outpatient Medications Prior to Visit  Medication Sig Dispense Refill   aspirin EC 81 MG tablet  Take 81 mg by mouth daily. Swallow whole.     clotrimazole (LOTRIMIN) 1 % cream Apply 1 application topically 2 (two) times daily. 30 g 0   Multiple Vitamins-Minerals (MULTIVITAMIN WITH MINERALS) tablet Take 1 tablet by mouth daily.     atorvastatin (LIPITOR) 40 MG tablet Take 1 tablet (40 mg total) by mouth daily at 6 PM. 90 tablet 1   clopidogrel (PLAVIX) 75 MG tablet Take 1 tablet (75 mg total) by mouth daily. 90 tablet 1   gabapentin (NEURONTIN) 100 MG capsule Take 1 capsule (100 mg total) by mouth 2 (two) times daily. 60 capsule 2   levothyroxine (SYNTHROID) 175 MCG tablet Take 1 tablet (175 mcg total) by mouth  daily. 90 tablet 1   lisinopril (ZESTRIL) 10 MG tablet Take 1 tablet (10 mg total) by mouth daily. 90 tablet 1   nitrofurantoin, macrocrystal-monohydrate, (MACROBID) 100 MG capsule Take 1 capsule (100 mg total) by mouth 2 (two) times daily. 10 capsule 0   No facility-administered medications prior to visit.    Allergies  Allergen Reactions   Codeine     ROS Review of Systems  Constitutional: Negative.   HENT: Negative.   Respiratory: Negative.   Endocrine: Negative.   Genitourinary: Negative for dysuria, flank pain, frequency, hematuria, pelvic pain, vaginal discharge and vaginal pain.  Neurological: Negative.   Psychiatric/Behavioral: Negative.       Objective:    Physical Exam HENT:     Head: Normocephalic and atraumatic.  Eyes:     Extraocular Movements: Extraocular movements intact.     Pupils: Pupils are equal, round, and reactive to light.  Cardiovascular:     Rate and Rhythm: Normal rate and regular rhythm.     Pulses: Normal pulses.     Heart sounds: Normal heart sounds.  Pulmonary:     Effort: Pulmonary effort is normal.     Breath sounds: Normal breath sounds.  Genitourinary:    Comments: Deferred per patient. Skin:    General: Skin is warm.  Neurological:     General: No focal deficit present.     Mental Status: She is alert and oriented to person, place, and time. Mental status is at baseline.  Psychiatric:        Mood and Affect: Mood normal.        Behavior: Behavior normal.        Thought Content: Thought content normal.        Judgment: Judgment normal.     BP 138/86 (BP Location: Right Arm, Patient Position: Sitting, Cuff Size: Large)    Pulse 72    Temp 97.8 F (36.6 C)    Wt 213 lb 1.6 oz (96.7 kg)    BMI 30.58 kg/m  Wt Readings from Last 3 Encounters:  02/02/20 213 lb 1.6 oz (96.7 kg)  12/15/19 209 lb (94.8 kg)  09/15/19 215 lb 3.2 oz (97.6 kg)   She was encouraged to continue on her weight loss regimen  Health Maintenance Due   Topic Date Due   Hepatitis C Screening  Never done   TETANUS/TDAP  Never done   PAP SMEAR-Modifier  Never done   MAMMOGRAM  Never done   INFLUENZA VACCINE  01/30/2020    There are no preventive care reminders to display for this patient.  Lab Results  Component Value Date   TSH 0.918 04/21/2019   Lab Results  Component Value Date   WBC 10.2 09/05/2018   HGB 11.2 (L) 09/05/2018   HCT  34.1 (L) 09/05/2018   MCV 95.3 09/05/2018   PLT 230 09/05/2018   Lab Results  Component Value Date   NA 142 02/24/2019   K 4.2 02/24/2019   CO2 23 02/24/2019   GLUCOSE 92 02/24/2019   BUN 16 02/24/2019   CREATININE 0.79 02/24/2019   BILITOT 0.3 02/24/2019   ALKPHOS 106 02/24/2019   AST 15 02/24/2019   ALT 16 02/24/2019   PROT 7.3 02/24/2019   ALBUMIN 4.6 02/24/2019   CALCIUM 9.5 02/24/2019   ANIONGAP 10 09/05/2018   Lab Results  Component Value Date   CHOL 156 02/24/2019   Lab Results  Component Value Date   HDL 46 02/24/2019   Lab Results  Component Value Date   LDLCALC 89 02/24/2019   Lab Results  Component Value Date   TRIG 103 02/24/2019   Lab Results  Component Value Date   CHOLHDL 3.4 02/24/2019   Lab Results  Component Value Date   HGBA1C 5.4 08/28/2018      Assessment & Plan:    1. Hyperlipidemia, unspecified hyperlipidemia type -She will continue on current treatment regimen. -She was advised to continue on Low fat Diet, like low fat dairy products eg skimmed milk -Avoid any fried food -Regular exercise/walk -Goal for Total Cholesterol is less than 200 -Goal for bad cholesterol LDL is less than 70 -Goal for Good cholesterol HDL is more than 45 -Goal for Triglyceride is less than 150 - atorvastatin (LIPITOR) 40 MG tablet; Take 1 tablet (40 mg total) by mouth daily at 6 PM.  Dispense: 90 tablet; Refill: 1  2. Cerebrovascular accident (CVA) due to occlusion of cerebral artery (HCC) - She will continue on Plavix, was advised to go to the ED for  hematuria, hematochezia and active bleeding. - clopidogrel (PLAVIX) 75 MG tablet; Take 1 tablet (75 mg total) by mouth daily.  Dispense: 90 tablet; Refill: 1  3. Numbness and tingling -She states that her neuropathy is under control with taking gabapentin. - gabapentin (NEURONTIN) 100 MG capsule; Take 1 capsule (100 mg total) by mouth 2 (two) times daily.  Dispense: 60 capsule; Refill: 2  4. Hypothyroidism, postablative - She is Euthyroid, and will continue current treatment regimen. - levothyroxine (SYNTHROID) 175 MCG tablet; Take 1 tablet (175 mcg total) by mouth daily.  Dispense: 90 tablet; Refill: 1  5. Essential (primary) hypertension - Her blood pressure is under control and she will continue current treatment regimen, was advised to continues on DASH, diet, exercise as tolerated and smoking cessation. - lisinopril (ZESTRIL) 10 MG tablet; Take 1 tablet (10 mg total) by mouth daily.  Dispense: 90 tablet; Refill: 1  6. Itching in the vaginal area - Will recheck urine and was advised to use Monistat, advised on medication side effects and to notify clinic. - Urinalysis; Future - UA/M w/rflx Culture, Routine; Future - miconazole (MONISTAT 7) 2 % vaginal cream; Place 1 Applicatorful vaginally at bedtime.  Dispense: 45 g; Refill: 0   7. Health care maintenance  - Ambulatory referral to Hematology / Oncology for Mammogram and Pap smear.    Follow-up:  This was Laurie Valenzuela last visit because she will be Medicare eligible next month. [facility] wishes her well with her care. She was advised to complete her Medicare application as soon as possible, but to notify clinic with any concerns.    Laurie Gough Trellis Paganini, NP

## 2020-02-06 LAB — UA/M W/RFLX CULTURE, ROUTINE
Bilirubin, UA: NEGATIVE
Glucose, UA: NEGATIVE
Ketones, UA: NEGATIVE
Leukocytes,UA: NEGATIVE
Nitrite, UA: POSITIVE — AB
RBC, UA: NEGATIVE
Specific Gravity, UA: 1.024 (ref 1.005–1.030)
Urobilinogen, Ur: 0.2 mg/dL (ref 0.2–1.0)
pH, UA: 5.5 (ref 5.0–7.5)

## 2020-02-06 LAB — MICROSCOPIC EXAMINATION
Casts: NONE SEEN /lpf
RBC, Urine: NONE SEEN /hpf (ref 0–2)

## 2020-02-06 LAB — URINE CULTURE, REFLEX

## 2020-02-08 ENCOUNTER — Other Ambulatory Visit: Payer: Self-pay | Admitting: Gerontology

## 2020-02-08 DIAGNOSIS — N390 Urinary tract infection, site not specified: Secondary | ICD-10-CM

## 2020-02-08 MED ORDER — AMOXICILLIN-POT CLAVULANATE 875-125 MG PO TABS: 1.0000 | ORAL_TABLET | Freq: Two times a day (BID) | ORAL | 0 refills | Status: AC

## 2020-02-28 ENCOUNTER — Emergency Department: Admission: EM | Admit: 2020-02-28 | Discharge: 2020-02-28 | Discharge status: Absent without leave | Payer: Self-pay

## 2020-02-28 ENCOUNTER — Ambulatory Visit
Admission: RE | Admit: 2020-02-28 | Discharge: 2020-02-28 | Disposition: A | Discharge status: Home / Self Care | Payer: Disability Insurance | Source: Ambulatory Visit | Attending: Internal Medicine | Admitting: Internal Medicine

## 2020-02-29 ENCOUNTER — Telehealth: Payer: Self-pay | Admitting: Licensed Clinical Social Worker

## 2020-03-01 NOTE — Telephone Encounter (Signed)
Called patient regarding assisting her with signing up for Medicare.  Patient informed me that she had already started the process and was not in need of any additional assistance at this time.

## 2020-03-03 ENCOUNTER — Telehealth: Payer: Self-pay | Admitting: Pharmacy Technician

## 2020-03-03 NOTE — Telephone Encounter (Signed)
Patient has Medicare beginning 03/01/20.  No longer meets [facility]'s eligibility criteria.  Patient notified.  Sherilyn Dacosta Care Manager [facility]

## 2020-04-10 ENCOUNTER — Other Ambulatory Visit: Payer: Self-pay

## 2020-04-10 ENCOUNTER — Ambulatory Visit
Admission: RE | Admit: 2020-04-10 | Discharge: 2020-04-10 | Disposition: A | Discharge status: Home / Self Care | Payer: Disability Insurance | Source: Ambulatory Visit | Attending: Internal Medicine | Admitting: Internal Medicine

## 2020-04-10 ENCOUNTER — Ambulatory Visit
Admission: RE | Admit: 2020-04-10 | Discharge: 2020-04-10 | Disposition: A | Discharge status: Home / Self Care | Payer: Disability Insurance | Attending: Internal Medicine | Admitting: Internal Medicine

## 2020-04-10 ENCOUNTER — Other Ambulatory Visit: Payer: Self-pay | Admitting: Internal Medicine

## 2020-04-10 DIAGNOSIS — M25371 Other instability, right ankle: Secondary | ICD-10-CM

## 2020-08-10 IMAGING — CT CT ANGIO HEAD
5 of 17 series · 13 of 47 positions shown · IV contrast (APPLIED)
Comparison: Prior CT and MRI from 08/28/2018

CLINICAL DATA: Follow-up examination for acute stroke.

EXAM:
CT ANGIOGRAPHY HEAD
TECHNIQUE: Multidetector CT imaging of the head was performed using the
standard protocol during bolus administration of intravenous
contrast. Multiplanar CT image reconstructions and MIPs were
obtained to evaluate the vascular anatomy.
CONTRAST:  75mL OMNIPAQUE IOHEXOL 350 MG/ML SOLN

[Series 7: head bone · axial · 0.45mm/px · z∈[-141,-83]mm · 2 of 88 slices shown (1 of 2)]
[im 30/88  bone]
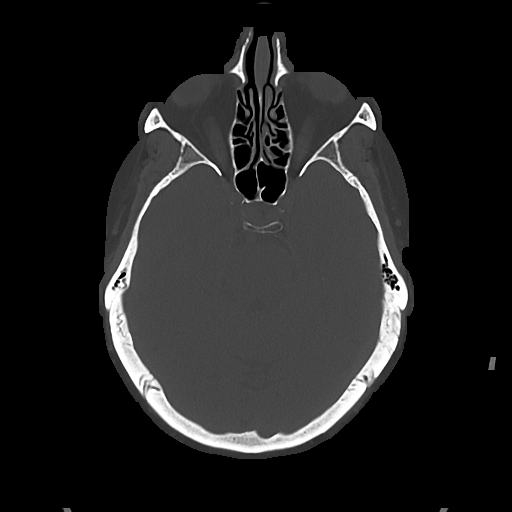
[im 59/88  bone]
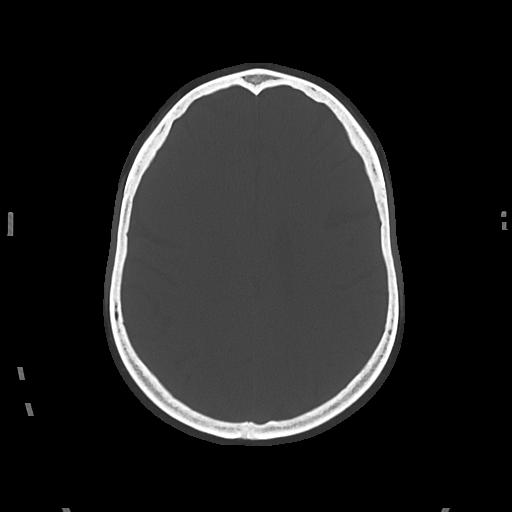

[Series 10: headangio 2.0 hr36 3 · axial · 0.46mm/px · z∈[-159,-73]mm · 3 of 87 slices shown]
[im 22/87  brain]
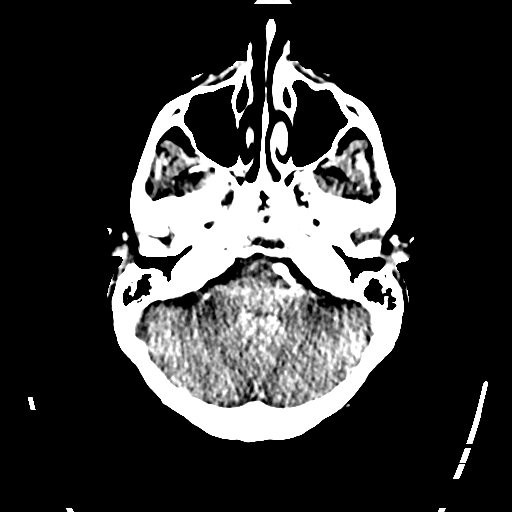
[im 44/87  bone]
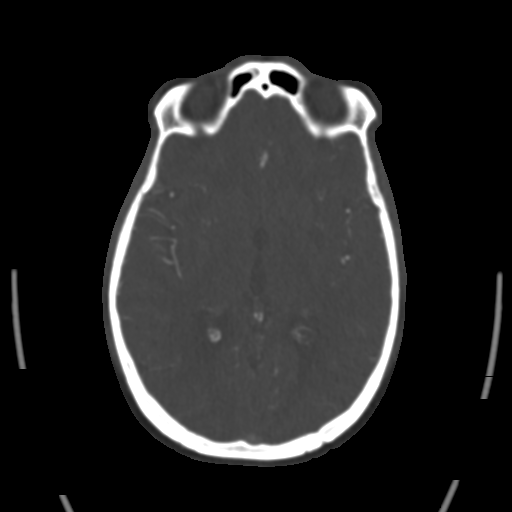
[im 65/87  brain]
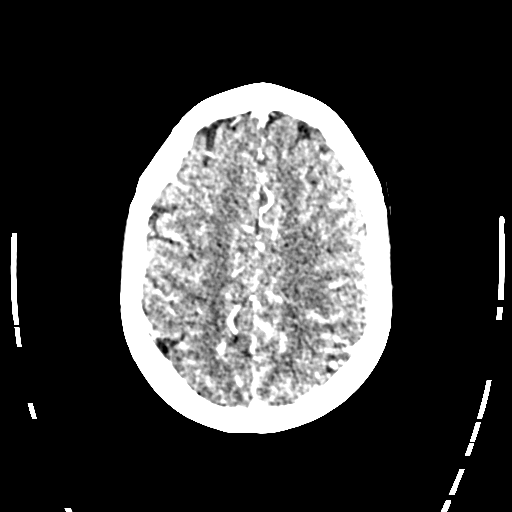

[Series 12: headangio 1.0 mpr cor · coronal · 0.34mm/px · 3 of 201 slices shown]
[im 51/201  brain]
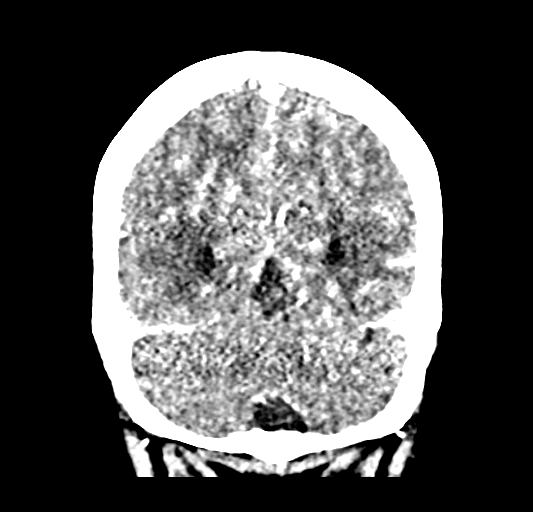
[im 101/201  brain]
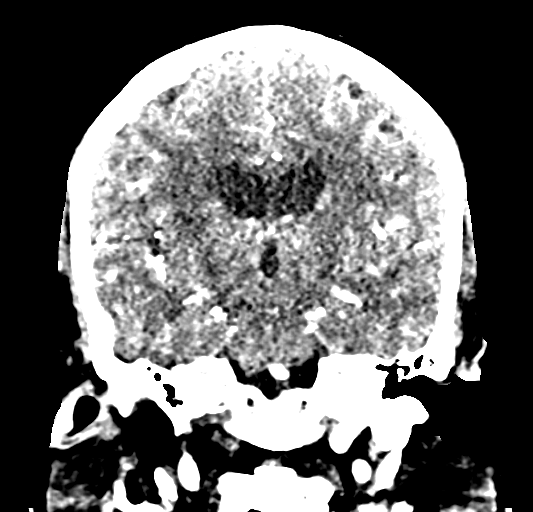
[im 151/201  brain]
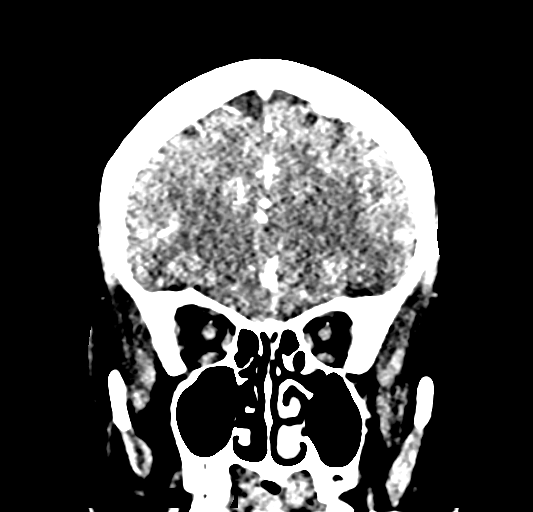

[Series 13: headangio 1.0 mpr sag · sagittal · 0.34mm/px · 2 of 199 slices shown]
[im 67/199  brain]
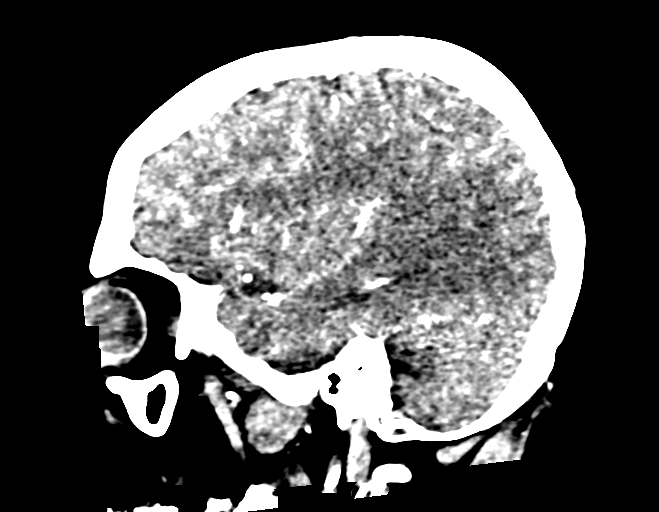
[im 133/199  brain]
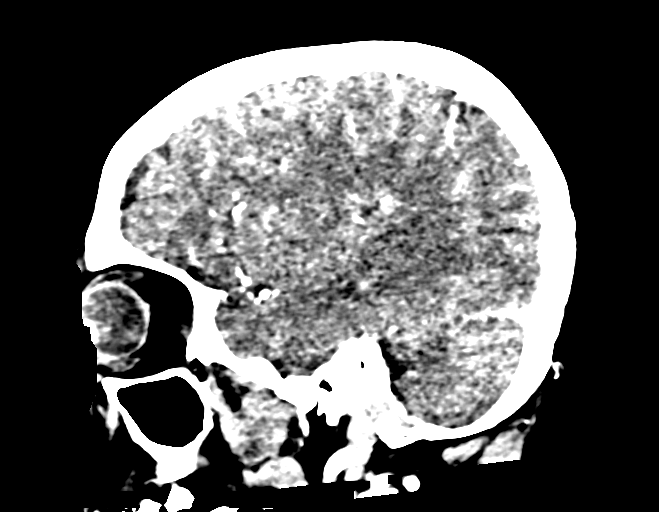

[Series 19: head bone · axial · 0.45mm/px · z∈[-157,-69]mm · 3 of 88 slices shown (2 of 2)]
[im 22/88  bone]
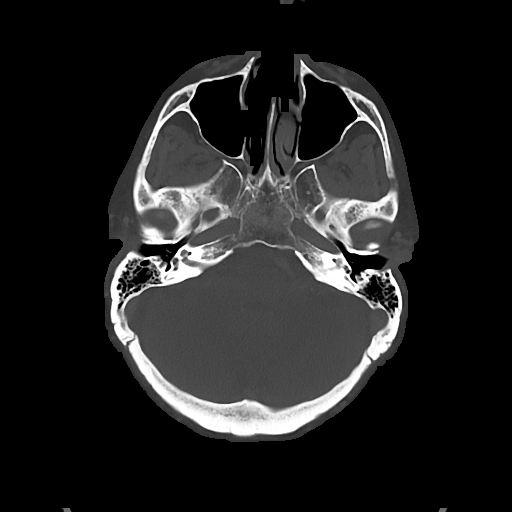
[im 44/88  bone]
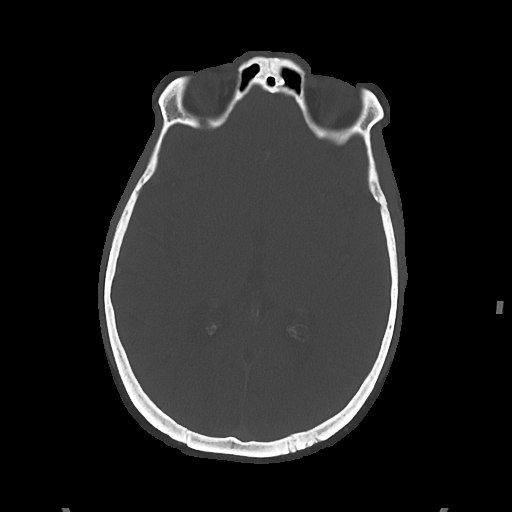
[im 66/88  bone]
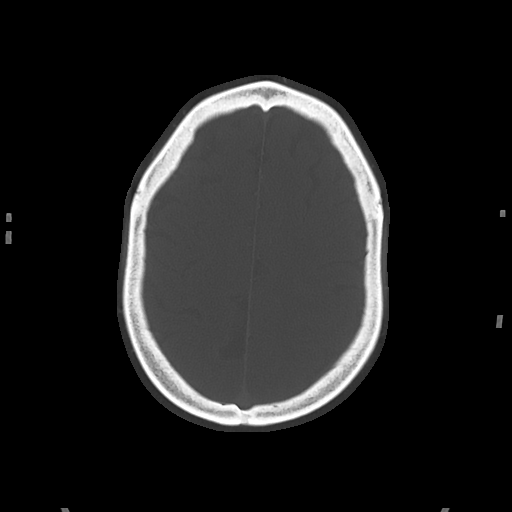

[13 of 47 positions shown; findings below may reference images not displayed]

FINDINGS: CT HEAD

Brain: Continued interval evolution of acute ischemic infarct
involving the left basal ganglia, stable in size and distribution
from recent MRI. Associated mild localized edema without significant
regional mass effect or midline shift. No evidence for hemorrhagic
transformation or other complication.

Remainder the brain is stable in appearance. No other acute large
vessel territory infarct. No intracranial hemorrhage. No mass lesion
or extra-axial fluid collection. Underlying chronic microvascular
ischemic disease again noted.

Vascular: No hyperdense vessel. Scattered vascular calcifications
noted within the carotid siphons.

Skull: Scalp soft tissues and calvarium within normal limits.

Sinuses: Paranasal sinuses are clear.  No mastoid effusion.

Orbits: Globes and orbital soft tissues within normal limits.

CTA HEAD

Anterior circulation: Visualized distal cervical segments of the
internal carotid arteries are widely patent and well opacified.
Distal cervical left ICA mildly tortuous. Petrous segments widely
patent bilaterally. Scattered atheromatous plaque within the
cavernous/supraclinoid ICAs with resultant mild multifocal stenosis.
No hemodynamically significant stenosis identified. A1 segments
patent bilaterally. Normal anterior communicating artery. Anterior
cerebral arteries widely patent to their distal aspects without
stenosis.

M1 segments widely patent bilaterally. Normal MCA bifurcations.
Distal MCA branches well perfused and symmetric.

Posterior circulation: Vertebral arteries patent to the
vertebrobasilar junction without stenosis. Right vertebral artery
slightly dominant. Posterior inferior cerebral arteries patent
bilaterally. Basilar widely patent to its distal aspect without
stenosis. Superior cerebral arteries patent bilaterally. Both of the
posterior cerebral arteries primarily supplied via the basilar.
Short-segment mild left P2 stenosis noted (series 14, image 20).
PCAs are otherwise widely patent to their distal aspects without
stenosis.

Venous sinuses: Patent.

Anatomic variants: None significant.  No intracranial aneurysm.

Delayed phase: No abnormal enhancement.
IMPRESSION: CT HEAD IMPRESSION

1. Normal expected interval evolution of acute ischemic left basal
ganglia infarct. Associated mild localized edema without significant
regional mass effect. No evidence for hemorrhagic transformation or
other complication.
2. No other new acute intracranial abnormality.

CTA HEAD IMPRESSION

1. Negative CTA for large vessel occlusion.
2. Mild atherosclerotic change involving the carotid siphons without
hemodynamically significant stenosis. No other high-grade or
correctable stenosis within the intracranial circulation.

## 2022-03-23 IMAGING — CR DG ANKLE 2V *R*
1 series · 2 of 2 positions shown · non-contrast
Comparison: September 03, 2018.

CLINICAL DATA: Right ankle instability.

EXAM:
RIGHT ANKLE - 2 VIEW

[Series 1: dg ankle 2 views right · 0.14mm/px · 2 of 2 slices shown]
[im 1/2]
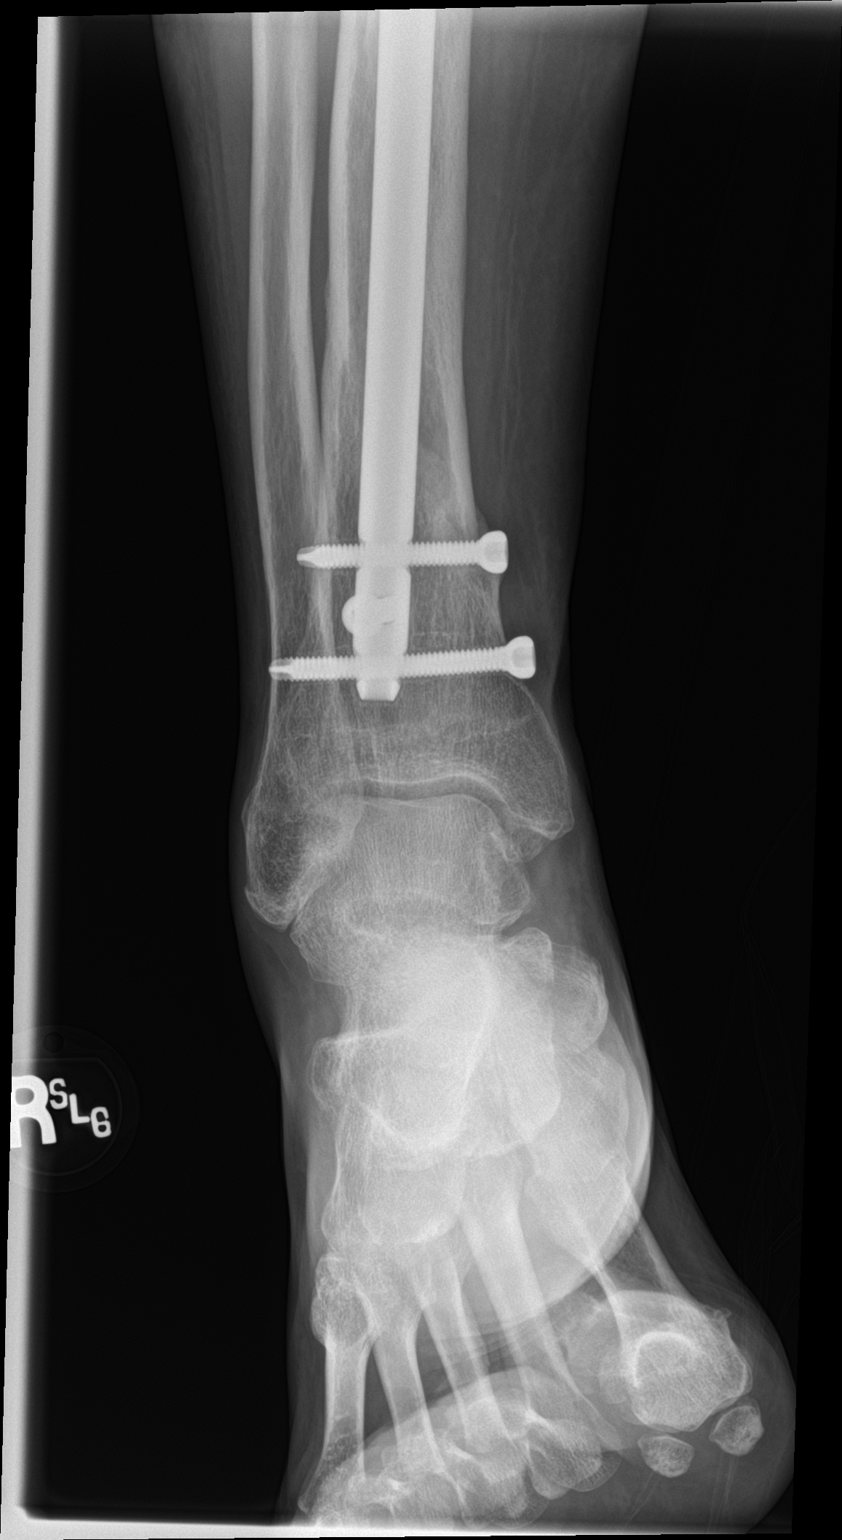
[im 2/2]
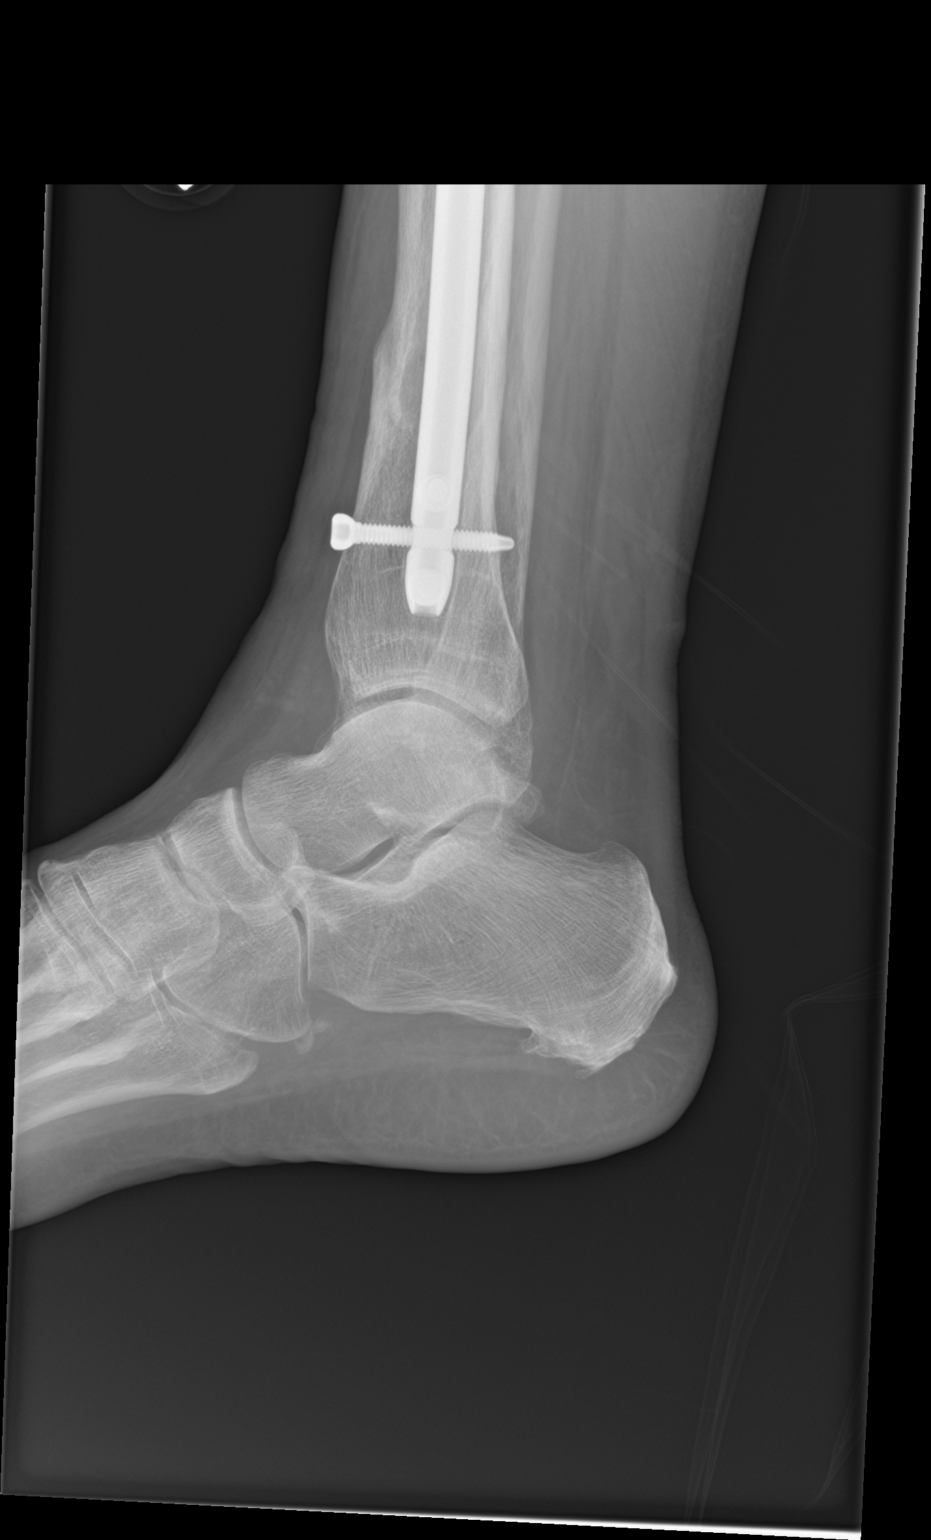

[2 of 2 positions shown; findings below may reference images not displayed]

FINDINGS: Status post surgical intramedullary rod fixation for repair of old
distal right tibial fracture. No acute fracture or dislocation is
noted. Talotibial joint is unremarkable. No soft tissue abnormality
is noted.
IMPRESSION: Status post surgical intramedullary rod fixation for repair of old
distal right tibial fracture. No acute abnormality is noted.

## 2022-07-25 ENCOUNTER — Encounter: Payer: Self-pay | Admitting: *Deleted

## 2022-12-31 ENCOUNTER — Encounter: Payer: Self-pay | Admitting: *Deleted
